# Patient Record
Sex: Female | Born: 1935 | Race: White | Hispanic: No | Smoking: Never smoker
Health system: Southern US, Community
[De-identification: ages and names within clinical notes are randomized; demographics above are authoritative.]

## PROBLEM LIST (undated history)

## (undated) DIAGNOSIS — I10 Essential (primary) hypertension: Secondary | ICD-10-CM

## (undated) DIAGNOSIS — M4800 Spinal stenosis, site unspecified: Secondary | ICD-10-CM

## (undated) DIAGNOSIS — N289 Disorder of kidney and ureter, unspecified: Secondary | ICD-10-CM

## (undated) DIAGNOSIS — R32 Unspecified urinary incontinence: Secondary | ICD-10-CM

## (undated) DIAGNOSIS — E039 Hypothyroidism, unspecified: Secondary | ICD-10-CM

## (undated) DIAGNOSIS — I2699 Other pulmonary embolism without acute cor pulmonale: Secondary | ICD-10-CM

## (undated) DIAGNOSIS — I82409 Acute embolism and thrombosis of unspecified deep veins of unspecified lower extremity: Secondary | ICD-10-CM

## (undated) DIAGNOSIS — M7989 Other specified soft tissue disorders: Secondary | ICD-10-CM

## (undated) DIAGNOSIS — Q600 Renal agenesis, unilateral: Secondary | ICD-10-CM

## (undated) HISTORY — PX: ABDOMINAL HYSTERECTOMY: SHX81

## (undated) HISTORY — PX: APPENDECTOMY: SHX54

## (undated) HISTORY — PX: KNEE SURGERY: SHX244

---

## 1998-03-13 ENCOUNTER — Inpatient Hospital Stay (HOSPITAL_COMMUNITY): Admission: RE | Admit: 1998-03-13 | Discharge: 1998-03-17 | Payer: Self-pay | Admitting: Orthopedic Surgery

## 1998-03-17 ENCOUNTER — Inpatient Hospital Stay (HOSPITAL_COMMUNITY)
Admission: RE | Admit: 1998-03-17 | Discharge: 1998-03-24 | Payer: Self-pay | Admitting: Physical Medicine and Rehabilitation

## 1998-03-25 ENCOUNTER — Encounter
Admission: RE | Admit: 1998-03-25 | Discharge: 1998-06-23 | Payer: Self-pay | Admitting: Physical Medicine and Rehabilitation

## 1998-08-18 ENCOUNTER — Other Ambulatory Visit: Admission: RE | Admit: 1998-08-18 | Discharge: 1998-08-18 | Payer: Self-pay | Admitting: *Deleted

## 2000-05-04 ENCOUNTER — Encounter: Payer: Self-pay | Admitting: Orthopedic Surgery

## 2000-05-04 ENCOUNTER — Inpatient Hospital Stay (HOSPITAL_COMMUNITY): Admission: EM | Admit: 2000-05-04 | Discharge: 2000-05-07 | Payer: Self-pay | Admitting: Emergency Medicine

## 2000-05-04 ENCOUNTER — Encounter: Payer: Self-pay | Admitting: Emergency Medicine

## 2000-05-05 ENCOUNTER — Encounter: Payer: Self-pay | Admitting: Emergency Medicine

## 2003-10-27 ENCOUNTER — Ambulatory Visit: Admission: RE | Admit: 2003-10-27 | Discharge: 2004-01-22 | Payer: Self-pay | Admitting: Radiation Oncology

## 2004-05-13 ENCOUNTER — Ambulatory Visit: Admission: RE | Admit: 2004-05-13 | Discharge: 2004-05-13 | Payer: Self-pay | Admitting: Radiation Oncology

## 2004-07-18 DIAGNOSIS — I2699 Other pulmonary embolism without acute cor pulmonale: Secondary | ICD-10-CM

## 2004-07-18 DIAGNOSIS — I82409 Acute embolism and thrombosis of unspecified deep veins of unspecified lower extremity: Secondary | ICD-10-CM

## 2004-07-18 HISTORY — DX: Acute embolism and thrombosis of unspecified deep veins of unspecified lower extremity: I82.409

## 2004-07-18 HISTORY — DX: Other pulmonary embolism without acute cor pulmonale: I26.99

## 2004-10-14 ENCOUNTER — Ambulatory Visit: Admission: RE | Admit: 2004-10-14 | Discharge: 2004-10-14 | Payer: Self-pay | Admitting: Radiation Oncology

## 2005-06-01 ENCOUNTER — Encounter: Admission: RE | Admit: 2005-06-01 | Discharge: 2005-06-01 | Payer: Self-pay | Admitting: Endocrinology

## 2005-06-04 ENCOUNTER — Inpatient Hospital Stay (HOSPITAL_COMMUNITY): Admission: EM | Admit: 2005-06-04 | Discharge: 2005-06-15 | Payer: Self-pay | Admitting: Emergency Medicine

## 2005-06-06 ENCOUNTER — Encounter: Payer: Self-pay | Admitting: Cardiology

## 2005-06-15 ENCOUNTER — Inpatient Hospital Stay: Admission: RE | Admit: 2005-06-15 | Discharge: 2005-06-21 | Payer: Self-pay | Admitting: Endocrinology

## 2008-08-26 ENCOUNTER — Inpatient Hospital Stay (HOSPITAL_COMMUNITY): Admission: RE | Admit: 2008-08-26 | Discharge: 2008-08-29 | Payer: Self-pay | Admitting: Orthopedic Surgery

## 2010-11-02 LAB — CBC
HCT: 27.8 % — ABNORMAL LOW (ref 36.0–46.0)
HCT: 27.9 % — ABNORMAL LOW (ref 36.0–46.0)
HCT: 33 % — ABNORMAL LOW (ref 36.0–46.0)
HCT: 36.1 % (ref 36.0–46.0)
Hemoglobin: 11.3 g/dL — ABNORMAL LOW (ref 12.0–15.0)
Hemoglobin: 12.2 g/dL (ref 12.0–15.0)
Hemoglobin: 9.4 g/dL — ABNORMAL LOW (ref 12.0–15.0)
Hemoglobin: 9.5 g/dL — ABNORMAL LOW (ref 12.0–15.0)
MCHC: 33.8 g/dL (ref 30.0–36.0)
MCHC: 33.8 g/dL (ref 30.0–36.0)
MCHC: 33.9 g/dL (ref 30.0–36.0)
MCHC: 34.2 g/dL (ref 30.0–36.0)
MCV: 96.1 fL (ref 78.0–100.0)
MCV: 98.2 fL (ref 78.0–100.0)
MCV: 98.6 fL (ref 78.0–100.0)
MCV: 99.3 fL (ref 78.0–100.0)
Platelets: 132 10*3/uL — ABNORMAL LOW (ref 150–400)
Platelets: 142 10*3/uL — ABNORMAL LOW (ref 150–400)
Platelets: 156 10*3/uL (ref 150–400)
Platelets: 99 10*3/uL — ABNORMAL LOW (ref 150–400)
RBC: 2.81 MIL/uL — ABNORMAL LOW (ref 3.87–5.11)
RBC: 2.83 MIL/uL — ABNORMAL LOW (ref 3.87–5.11)
RBC: 3.43 MIL/uL — ABNORMAL LOW (ref 3.87–5.11)
RBC: 3.66 MIL/uL — ABNORMAL LOW (ref 3.87–5.11)
RDW: 13.5 % (ref 11.5–15.5)
RDW: 13.5 % (ref 11.5–15.5)
RDW: 14 % (ref 11.5–15.5)
RDW: 15.6 % — ABNORMAL HIGH (ref 11.5–15.5)
WBC: 13.3 10*3/uL — ABNORMAL HIGH (ref 4.0–10.5)
WBC: 4.2 10*3/uL (ref 4.0–10.5)
WBC: 7 10*3/uL (ref 4.0–10.5)
WBC: 8.4 10*3/uL (ref 4.0–10.5)

## 2010-11-02 LAB — BASIC METABOLIC PANEL
BUN: 30 mg/dL — ABNORMAL HIGH (ref 6–23)
BUN: 33 mg/dL — ABNORMAL HIGH (ref 6–23)
BUN: 41 mg/dL — ABNORMAL HIGH (ref 6–23)
BUN: 42 mg/dL — ABNORMAL HIGH (ref 6–23)
CO2: 19 mEq/L (ref 19–32)
CO2: 23 mEq/L (ref 19–32)
CO2: 24 mEq/L (ref 19–32)
CO2: 26 mEq/L (ref 19–32)
CO2: 26 mEq/L (ref 19–32)
Calcium: 7.7 mg/dL — ABNORMAL LOW (ref 8.4–10.5)
Calcium: 7.9 mg/dL — ABNORMAL LOW (ref 8.4–10.5)
Calcium: 8.1 mg/dL — ABNORMAL LOW (ref 8.4–10.5)
Calcium: 9.1 mg/dL (ref 8.4–10.5)
Chloride: 105 mEq/L (ref 96–112)
Chloride: 107 mEq/L (ref 96–112)
Chloride: 107 mEq/L (ref 96–112)
Chloride: 108 mEq/L (ref 96–112)
Creatinine, Ser: 1.49 mg/dL — ABNORMAL HIGH (ref 0.4–1.2)
Creatinine, Ser: 2.23 mg/dL — ABNORMAL HIGH (ref 0.4–1.2)
Creatinine, Ser: 2.29 mg/dL — ABNORMAL HIGH (ref 0.4–1.2)
Creatinine, Ser: 2.36 mg/dL — ABNORMAL HIGH (ref 0.4–1.2)
GFR calc Af Amer: 24 mL/min — ABNORMAL LOW (ref >60)
GFR calc Af Amer: 25 mL/min — ABNORMAL LOW (ref >60)
GFR calc Af Amer: 25 mL/min — ABNORMAL LOW (ref >60)
GFR calc Af Amer: 41 mL/min — ABNORMAL LOW (ref >60)
GFR calc non Af Amer: 20 mL/min — ABNORMAL LOW (ref >60)
GFR calc non Af Amer: 20 mL/min — ABNORMAL LOW (ref >60)
GFR calc non Af Amer: 21 mL/min — ABNORMAL LOW (ref >60)
GFR calc non Af Amer: 34 mL/min — ABNORMAL LOW (ref >60)
Glucose, Bld: 105 mg/dL — ABNORMAL HIGH (ref 70–99)
Glucose, Bld: 113 mg/dL — ABNORMAL HIGH (ref 70–99)
Glucose, Bld: 113 mg/dL — ABNORMAL HIGH (ref 70–99)
Glucose, Bld: 125 mg/dL — ABNORMAL HIGH (ref 70–99)
Glucose, Bld: 98 mg/dL (ref 70–99)
Potassium: 4.2 mEq/L (ref 3.5–5.1)
Potassium: 4.4 mEq/L (ref 3.5–5.1)
Potassium: 4.8 mEq/L (ref 3.5–5.1)
Potassium: 4.8 mEq/L (ref 3.5–5.1)
Potassium: 5.3 mEq/L — ABNORMAL HIGH (ref 3.5–5.1)
Sodium: 135 mEq/L (ref 135–145)
Sodium: 136 mEq/L (ref 135–145)
Sodium: 137 mEq/L (ref 135–145)
Sodium: 140 mEq/L (ref 135–145)
Sodium: 140 mEq/L (ref 135–145)

## 2010-11-02 LAB — PROTIME-INR
INR: 1.1 (ref 0.00–1.49)
INR: 1.2 (ref 0.00–1.49)
INR: 1.4 (ref 0.00–1.49)
INR: 2.1 — ABNORMAL HIGH (ref 0.00–1.49)
INR: 2.2 — ABNORMAL HIGH (ref 0.00–1.49)
Prothrombin Time: 14.6 seconds (ref 11.6–15.2)
Prothrombin Time: 15.7 seconds — ABNORMAL HIGH (ref 11.6–15.2)
Prothrombin Time: 17.8 seconds — ABNORMAL HIGH (ref 11.6–15.2)
Prothrombin Time: 25.3 seconds — ABNORMAL HIGH (ref 11.6–15.2)
Prothrombin Time: 26.1 seconds — ABNORMAL HIGH (ref 11.6–15.2)

## 2010-11-02 LAB — DIFFERENTIAL
Basophils Absolute: 0 10*3/uL (ref 0.0–0.1)
Basophils Relative: 0 % (ref 0–1)
Eosinophils Absolute: 0.1 10*3/uL (ref 0.0–0.7)
Eosinophils Relative: 1 % (ref 0–5)
Lymphocytes Relative: 17 % (ref 12–46)
Lymphs Abs: 0.7 10*3/uL (ref 0.7–4.0)
Monocytes Absolute: 0.3 10*3/uL (ref 0.1–1.0)
Monocytes Relative: 8 % (ref 3–12)
Neutro Abs: 3.1 10*3/uL (ref 1.7–7.7)
Neutrophils Relative %: 74 % (ref 43–77)

## 2010-11-02 LAB — TYPE AND SCREEN
ABO/RH(D): A POS
Antibody Screen: NEGATIVE

## 2010-11-02 LAB — URINALYSIS, ROUTINE W REFLEX MICROSCOPIC
Bilirubin Urine: NEGATIVE
Glucose, UA: NEGATIVE mg/dL
Hgb urine dipstick: NEGATIVE
Ketones, ur: NEGATIVE mg/dL
Nitrite: NEGATIVE
Protein, ur: NEGATIVE mg/dL
Specific Gravity, Urine: 1.017 (ref 1.005–1.030)
Urobilinogen, UA: 0.2 mg/dL (ref 0.0–1.0)
pH: 6 (ref 5.0–8.0)

## 2010-11-02 LAB — ABO/RH: ABO/RH(D): A POS

## 2010-11-02 LAB — APTT
aPTT: 26 seconds (ref 24–37)
aPTT: 33 seconds (ref 24–37)

## 2010-11-30 NOTE — Op Note (Signed)
NAMEANELA, BENSMAN             ACCOUNT NO.:  [id number]   MEDICAL RECORD NO.:  [id number]          PATIENT TYPE:  INP   LOCATION:  0003                         FACILITY:  [facility]   PHYSICIAN:  Madlyn Frankel. Charlann Boxer, M.D.  DATE OF BIRTH:  03-20-1924   DATE OF PROCEDURE:  DATE OF DISCHARGE:                               OPERATIVE REPORT   PREOPERATIVE DIAGNOSIS:  Right knee osteoarthritis with significant  severe valgus deformity, worse dynamically with gait and weightbearing,  history of left total knee replacement.   POSTOPERATIVE DIAGNOSIS:  Right knee osteoarthritis with significant  severe valgus deformity, worse dynamically with gait and weightbearing,  history of left total knee replacement including obesity and large lower  extremity.   PROCEDURE:  I would have this to be a right total knee replacement with  a 22 modifier due to difficulty of primary knee due to her ligament  laxity and size of her lower extremity.   Components we utilized were a DePuy TC 3 femur utilizing a size 3 TC 3  femur, a size 3 MBT revision tray, a 17.5 TC 3 tibial insert and a 38  patellar button.   SURGEON:  Madlyn Frankel. Charlann Boxer, M.D.   ASSISTANT:  Dwyane Luo, PA-C.   ANESTHESIA:  Duramorph spinal.   SPECIMEN:  None.   FINDINGS:  None.   COMPLICATIONS:  None.   DRAINS:  One Hemovac.   TOURNIQUET TIME:  75 minutes at 250 mmHg.   INDICATIONS FOR PROCEDURE:  [name] is 6 77 year old female referred  for surgical consideration.  She had a history of left total knee  replacement.  She had delayed a significant amount of time before her  right knee was considered with progressive valgus deformity and  increasing pain.  She had failed conservative measures.  Despite being  73, she felt that her quality life was significantly disrupted and  wished to proceed with surgical intervention.  We discussed the  increased risk of infection due to her size of her lower extremity.  Discussed the nature of  her surgery with a valgus nature and the  correctability of it.  Consent was obtained for the benefit of pain  relief.   PROCEDURE IN DETAIL:  The patient was brought to operative theater.  Once adequate anesthesia and preoperative antibiotics, Ancef  administered 2 grams, the patient was positioned supine.  Proximal thigh  tourniquet was placed.  The right lower extremity was pre scrubbed and  prepped and draped in sterile fashion.  Time-out was performed  identifying the patient and extremity.  The leg was exsanguinated,  tourniquet elevated to 250 mmHg.   The patient had a very large lower extremity, slightly valgus tended.  A  midline incision was performed in an attempt to have a straight incision  at closure.  A sharp dissection was carried down to the soft tissue  planes created overlying the extensor mechanism.  A median arthrotomy  was chosen due to her correctable valgus deformity.  Following initial  debridement, attention was directed to the patella.  Precut measurement  was 23 mm.  I resected down to  13 mm and used a 38 patellar button which  covered the cut surface the best.   Lug holes were drilled and a metal shim was placed to protect the  patella from the oscillating saw and retractors.  Attention was now  directed to the femur.  She was noted to have severe osteophyte  formation over the notch with advanced degenerative changes  tricompartmentally.  The proximal medial aspect of the tibia had very  limited peel due to the already lax collateral ligament that was noted.   Intramedullary canal was opened with a drill and irrigated to prevent  fat emboli.  I placed intramedullary rod at 5 degrees of valgus and  resected 10 mm of bone off the distal femur.  This amounted to a very  small cut laterally.   Attention was now directed to the tibia.  The tibia was subluxated  anteriorly.  I used the extramedullary guide and resected 10 mm of bone  off the lateral proximal  tibia.  She had a relatively neutral appearing  tibia.  Despite the advanced lateral compartment degenerative changes.  Following this cut, which appeared to be of normal height, I was able to  fully inspect the status of the MCL which was noted to be lax.  At this  point I decided I would go to a TC 3 component to provide some inherent  mechanical stability to a lax dynamic component of her knee.   With this, this was opened on the back field relatively efficiently.  In  the interim I went back to the femur.  I set the rotation based off the  proximal cut of the tibia which I checked with the alignment rod to  confirm it was perpendicular.   I sized the femur to be a size 3.  The rotation block was pinned into  position based off the proximal cut tibia and then exchanged for the 4  in 1 cutting block.  The anterior, posterior and chamfer cuts were then  made without difficulty or notching.   At this point, the TC 3 femoral component had been opened and the box  cut was made based off this.  The TC 3 box cut was made using the TC 3  instrumentation.   Tibia was then subluxated anteriorly and the tibia was prepared for MBT  tray.  Initially I chose a size 3 tibial tray, drilled it for the MBT  tray and then put a 3 keel trial in place.  Trial reduction now carried  out.  With this, I had released some of the lateral ligaments to help  with the balancing purposes.  I trialed up to a size 15.  There was  noted to be some lateral subluxation of patella and so I did a lateral  release as well.   At this point, the trial components were removed.  We injected the  synovial capsule junction with 0.25% Marcaine with epinephrine and 1 mL  of Toradol.  Final components were opened holding the polyethylene  insert.  Once the components were opened and then the final debridements  and cut surface dried, the final components were cemented into position,  the tibia first and then femur.  The knee  was brought to extension with  a 15-mm insert.   I felt there was a little bit of play in this medial side, so I ended up  choosing the 17.5 poly to help with the medial balancing.   Once cement  cured, excessive cement was removed throughout the knee.  I  examined the posterior aspect of the knee to make sure it was clear.  The final 17.5 insert to match the size 3 TC 3 femur was inserted.  The  knee was reduced, irrigated again with normal saline solution.  A medium  Hemovac drain was placed deep.  The extensor mechanism was then  reapproximated using #1 Vicryl.  The remainder of the wound was closed  with 2-0 Vicryl and  staples on skin due to the thin nature of the skin.  The tourniquet was  let down after 75 minutes.  The knee was cleaned, dried and dressed  sterilely with Xeroform and sterile bulky Jones dressing.  She was  brought to the recovery room in stable condition.  She tolerated the  procedure well.      Madlyn Frankel Charlann Boxer, M.D.  Electronically Signed     MDO/MEDQ  D:  08/26/2008  T:  08/26/2008  Job:  [id number]

## 2010-11-30 NOTE — Discharge Summary (Signed)
[name], [name]             ACCOUNT NO.:  [id number]   MEDICAL RECORD NO.:  [id number]          PATIENT TYPE:  INP   LOCATION:  1440                         FACILITY:  [facility]   PHYSICIAN:  Madlyn Frankel. Charlann Boxer, M.D.  DATE OF BIRTH:  Nov 08, 1923   DATE OF ADMISSION:  08/26/2008  DATE OF DISCHARGE:  08/29/2008                               DISCHARGE SUMMARY   ADMITTING DIAGNOSES:  1. Osteoarthritis.  2. Hypertension.  3. Hypercholesteremia.  4. History of pulmonary embolus on Coumadin.  5. Vertigo.  6. Urinary incontinence.  7. Right mandibular squamous cell cancer.  8. Chronic renal insufficiency.  9. Degenerative disk disease.   DISCHARGE DIAGNOSES:  1. Osteoarthritis.  2. Hypertension.  3. Hypercholesteremia.  4. History of pulmonary embolus on Coumadin.  5. Vertigo.  6. Urinary incontinence.  7. Right mandibular squamous cell cancer.  8. Chronic renal insufficiency.  9. Degenerative disk disease.   HISTORY OF PRESENT ILLNESS:  An 77 year old female with a history of  right knee pain secondary to osteoarthritis with significant valgus  deformity.  She had utilized a cane prior to surgery.  She was  presurgically assessed by her primary care physician, Dr. Corrin Milhorn.  Had full cardiology workup which was negative by Dr. Roger Shelter.   CONSULTATIONS IN HOSPITAL:  Dr. Roger Shelter with cardiology due to  irregular heart beat postoperatively.   PROCEDURE:  Right total knee replaced by surgeon Dr. Durene Romans,  assistant Dwyane Luo, PA-C.   LABORATORY DATA:  CBC final reading white blood cell 13.3, hemoglobin  10.6, hematocrit 30.9 and platelets 99.  Metabolic panel:  Sodium 140,  potassium 4.2, BUN 39, creatinine 2.14 and decreasing and 98 glucose.  Coags:  Her PT was 25.3, INR was 2.1 on August 29, 2008.  Calcium was  8.1.   RADIOLOGY:  Chest x-ray found in chart.   CARDIOLOGY:  EKG and full cardiology workup performed prior to  admission.   HOSPITAL COURSE:  The patient underwent a right total knee replacement  by Dr. Charlann Boxer, admitted to fourth floor telemetry due to irregular heart  beat.  She did remain hemodynamically stable although she did have acute  blood loss anemia requiring transfusion of a couple of units while in  the hospital.  Rehab wise she made slow progress and she had difficulty  with balance, difficulty with quad function.  Her dressing was changed  and there was no significant drainage from the wound.  There were a  little bit of blood droplets however, no sign of infection.  Dr. Deborah Chalk  saw the patient on August 27, 2008 due to the irregular heart beat  with recommendations of continued monitoring as well as continued use of  beta-blockers which was started preoperatively.  Coumadin was continued.  She reached therapeutic levels prior to discharge.  She had improving  creatinine function after transfusion.  When seen on August 29, 2008  she was afebrile, no significant events, no complaints, hemodynamically  was therapeutic with decreasing creatinine level and was ready for  discharge to nursing facility rehab.   DISCHARGE DISPOSITION:  Discharge to  skilled nursing facility rehab in  stable and improved condition.   DISCHARGE PHYSICAL THERAPY:  She is weightbearing as tolerated with the  use of a rolling walker.  Want to maintain a knee immobilizer until she  has increased quad function and is able to do a straight leg raise.  At  the point she can do a straight leg raise  with good quad function,  discontinued the knee immobilizer.   DISCHARGE DIET:  Heart-healthy.   DISCHARGE WOUND CARE:  Keep wound dry.  Change dressing on daily basis.  The patient may shower, just cover with plastic or other impervious  surface, tape edges.  Change dressing on daily basis.   DISCHARGE MEDICATIONS:  1. Coumadin dosage which is 1 mg tablets - she take 3 tablets in the      morning.  2. Crestor 5 mg p.o.  q.a.m.  3. Metoprolol 25 mg one p.o. b.i.d.  4. Ramipril 10 mg one p.o. daily.  5. Furosemide 20 mg one p.o. daily.  6. Potassium chloride 10 mEq p.o. daily.  7. Aspirin 81 mg - hold.  8. Iron 325 mg one p.o. t.i.d. x2 weeks then resume daily.  9. Robaxin 500 mg one p.o. q.6 h.  10.Colace 100 mg p.o. b.i.d.  11.MiraLax 17 grams p.o. daily.  Both the Colace and MiraLax can be      p.r.n. while on narcotics.  12.Norco 7.5/325 one to two p.o. q.4-6 h. p.r.n. pain.   DISCHARGE FOLLOWUP:  Follow up with Dr. Charlann Boxer at phone number [phone] in  2 weeks for appointment, already has an appointment scheduled on  September 08, 2008.   Any cardiology related questions, refer to Dr. Deborah Chalk.  Her primary  care physician is Dr. Dagoberto Ligas.     ______________________________  Yetta Glassman [address], [place]      Madlyn Frankel. Charlann Boxer, M.D.  Electronically Signed    BLM/MEDQ  D:  08/29/2008  T:  08/29/2008  Job:  [id number]   cc:   Colleen Can. Deborah Chalk, M.D.  Fax: [phone]   Alfonse Alpers. Dagoberto Ligas, M.D.  Fax: [phone]

## 2010-11-30 NOTE — H&P (Signed)
[name], [name]             ACCOUNT NO.:  [id number]   MEDICAL RECORD NO.:  [id number]          PATIENT TYPE:  INP   LOCATION:                               FACILITY[facility]  [facility]   PHYSICIAN:  Madlyn Frankel. Charlann Boxer, M.D.  DATE OF BIRTH:  01-21-1924   DATE OF ADMISSION:  08/26/2008  DATE OF DISCHARGE:                              HISTORY & PHYSICAL   REASON FOR ADMISSION:  Right knee osteoarthritis.   HISTORY OF PRESENT ILLNESS:  An 77 year old female with a history of  right knee pain secondary to osteoarthritis with significant valgus  deformity.  Utilizes cane for ambulation.  She has been refractory to  all conservative treatment.  Been presurgically assessed by her primary  care physician, Dr. Corrin Mcknight.   PAST MEDICAL HISTORY:  1. Osteoarthritis.  2. Hypertension.  3. Hypercholesteremia.  4. History of pulmonary embolus on Coumadin.  5. Vertigo.  6. Urinary incontinence.   PAST SURGICAL HISTORY:  1. Appendectomy.  2. Hysterectomy 1994.  3. Left total knee replacement in 1996.  4. Bilateral cataract surgeries 2001.  5. 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  6. Mohs surgery in June of 2007 of her nose and forehead.   FAMILY HISTORY:  Heart disease.   SOCIAL HISTORY:  She is widowed, nonsmoker.  She does live alone, is  planning rehab stay, preferably [facility], secondary choice [facility].   DRUG ALLERGIES:  PENICILLIN AND CODEINE.   MEDICATIONS:  1. Crestor 5 mg p.o. daily.  2. Metoprolol 25 mg one p.o. b.i.d.  3. Darvocet N-100.  4. Coumadin 3 mg daily.  5. Aspirin 81 mg daily.  6. Iron 1 daily.  7. Potassium 10 mEq p.o. daily.  8. Furosemide 20 mg p.o. daily.  9. Ramipril 10 mg one p.o. daily.   She stopped her Coumadin on February 3, started Lovenox on February 4  for five injections for bridging therapy.   REVIEW OF SYSTEMS:  HEENT AND NEURO:  She has balance problems and  ringing in her ears.  RESPIRATORY:  Last chest x-ray was on August 14, 2008.   CARDIOVASCULAR:  She does have increased swelling and edema and  her last EKG was done on August 14, 2008.  GENITOURINARY:  She has  incontinence and increased urination at night.  MUSCULOSKELETAL:  She  has back pain.  Otherwise see HPI.   PHYSICAL EXAM:  Pulse 72, respirations 16, blood pressure 144/80.  GENERAL:  Awake, alert and oriented.  HEENT:  Normocephalic.  NECK:  Supple, no carotid bruits.  CHEST:  Lungs are clear to auscultation bilaterally.  BREASTS:  Deferred.  HEART:  Regular rate and rhythm, S1-S2 distinct.  ABDOMEN:  Soft, nontender, nondistended, bowel sounds present.  GENITOURINARY:  Deferred.  EXTREMITIES:  Right knee has significant valgus deformity.  SKIN:  No cellulitis right lower extremity.  NEUROLOGIC:  Intact distal sensibilities.   LABORATORY DATA:  All labs pending presurgical testing.  EKG, chest x-  ray performed by primary care physician on August 14, 2008.   IMPRESSION:  Right knee osteoarthritis with significant valgus  deformity.   PLAN OF  ACTION:  Right total knee replacement at [facility]  August 26, 2008 by surgeon Dr. Durene Romans.  Risks and complications  were discussed.   No postoperative medications were provided as patient is planning rehab  stay at [facility].     ______________________________  Yetta Glassman. [address], [place]      Madlyn Frankel. Charlann Boxer, M.D.  Electronically Signed    BLM/MEDQ  D:  08/20/2008  T:  08/20/2008  Job:  [id number]   cc:   Alfonse Alpers. Dagoberto Ligas, M.D.  Fax: [phone]

## 2010-11-30 NOTE — Consult Note (Signed)
[name], [name]             ACCOUNT NO.:  [id number]   MEDICAL RECORD NO.:  [id number]          PATIENT TYPE:  INP   LOCATION:  1440                         FACILITY:  [facility]   PHYSICIAN:  Colleen Can. Deborah Chalk, M.D.DATE OF BIRTH:  [birth year]   DATE OF CONSULTATION:  08/27/2008  DATE OF DISCHARGE:                                 CONSULTATION   We were asked by Dr. Charlann Boxer to evaluate [name] for an irregular  heartbeat.  She is an 77 year old female with a history of obesity,  hypertension, prior normal heart catheterization, remote syncope and  osteoarthritis.  She presents for this admission for a right total knee  replacement.  Concern is for PACs and a questionable atrial fibrillation  earlier today.   PAST MEDICAL HISTORY:  1. Syncope associated with elevated cardiac enzymes.  A subsequent      cardiac catheterization was normal in November 2006.  2. Spinal stenosis.  3. Left knee surgery.  4. Hypercholesterolemia.  5. Obesity.  6. Gastritis.  7. Right mandibular squamous cell cancer.  8. Chronic renal insufficiency.  9. Herniated disk.  10.Hysterectomy.  11.Appendectomy.  12.History of pulmonary embolism.  13.Chronic Coumadin anticoagulation.  14.Vertigo.  15.Urinary incontinence.  16.Recent negative Cardiolite study done as a preoperative evaluation.   She has had a history of allergies to:  1. PENICILLIN.  2. LIPITOR.  3. CODEINE.   CURRENT MEDICINES:  1. Crestor 5 mg a day.  2. Metoprolol 25 mg b.i.d.  3. Darvocet compound.  4. Coumadin 3 mg a day.  5. Aspirin.  6. Iron.  7. Lasix 20 mg a day.  8. KCl 10 mEq a day.  9. Ramipril 10 mg per day.   FAMILY HISTORY:  Father died of Alzheimer's disease.  Mother lived into  her upper 96s.  One brother has heart disease.   SOCIAL HISTORY:  She is widowed.  She retired from [employer].  No alcohol, no tobacco.   REVIEW OF SYSTEMS:  No chest pain, no shortness of breath.  Overall, she  is doing  well postop.  Her appetite is good.  She is eating well.  All  other review of systems is negative.   EXAM:  GENERAL:  She is in no acute distress.  She was eating dinner.  Awake and alert. Responds appropriately.  Blood pressure is 114/42, heart rate is in the 80s, it is normal.  SKIN:  Warm and dry.  Color is normal.  EYES:  She had pupils equally round and reactive to light.  Sclerae is  nonanicteric.  Conjunctivae is clear.  Oropharynx unremarkable.  NECK:  Supple.  There is no adenopathy, no masses.  There is a scar on  the right neck.  LUNGS:  Clear with decreased breath sounds. No dyspnea.  HEART:  Regular rate and rhythm. No murmur.  ABDOMEN:  Obese with positive bowel sounds. Nontender.  EXTREMITIES:  Limited ROM of the R knee. Right knee in the CPM.  No  edema.  Gait is not tested.  NEUROLOGIC:  Intact. No gross focal deficits.   PERTINENT LABS:  Hematocrit 27,  white count 8000, platelets 132,000.  Potassium is 5.3, glucose 125, BUN 33, creatinine 2.2.  INR is 1.2.  EKG  no current tracings.  The preop adenosine/Cardiolite tracings were  normal.  It did show an rSR-prime in V1.   OVERALL IMPRESSION:  1. Postoperative total knee replacement.  2. Probable postoperative premature atrial contractions.  3. History of normal catheterization in 2006.   PLAN:  We will continue beta blockers.  Continue telemetry.  We will see  tomorrow and be available should problems rise otherwise.      Colleen Can. Deborah Chalk, M.D.  Electronically Signed     SNT/MEDQ  D:  08/27/2008  T:  08/27/2008  Job:  [id number]

## 2010-12-03 NOTE — Consult Note (Signed)
NAMEROMY, IPOCK             ACCOUNT NO.:  [id number]   MEDICAL RECORD NO.:  [id number]          PATIENT TYPE:  INP   LOCATION:  4710                         FACILITY:  [facility]   PHYSICIAN:  Vanetta Mulders, MD         DATE OF BIRTH:  08/09/1923   DATE OF CONSULTATION:  06/08/2005  DATE OF DISCHARGE:                                   CONSULTATION   HISTORY OF PRESENT ILLNESS:  Mrs. Jamison is a 77 year old woman with  multiple problems, degenerative joint disease, hypertension, obesity,  hyperlipidemia, status post squamous cell carcinoma of the right mandible.  She presents to [facility]  for syncope.  She was admitted for  evaluation on June 04, 2005 and night prior to admission the patient  fell and lost consciousness.  She cannot recall for how long she passed out.  She did not have any ___________ or bowel incontinence and no tongue biting  as far as she can remember.  She felt lightheaded right before the syncopal  episode and she denied any vertigo at admission.  She had a similar episode  prior to admission when she tried to stand up and she again fell and lost  consciousness when Dr. Sharene Skeans, neurology, saw her in the hospital.  During  the current hospitalization, the patient presented with a creatinine of 1.8  and BUN of 50 and potassium of 5.  D-dimer was found elevated to more than  20 and cardiac markers had been positive.  The patient underwent cardiac  catheterization which showed no coronary artery disease.  Her creatinine has  been stable throughout hospitalization.  It increased slightly to a level of  2.4 on the second day after admission but when seen by nephrology, her  creatinine is approximately at baseline, being 2 and BUN 43.  The patient  also had 2-D echo done that showed left ventricular ejection fraction within  normal limits but right ventricle moderately to markedly dilated, mild to  moderate tricuspid regurgitation. The patient also  underwent an ultrasound  of the kidneys that could not visualize the left kidney, so nephrology was  consulted for possible renal artery stenosis on the left with ?.   ALLERGIES:  The patient is allergic to PENICILLIN, CODEINE, and LIPITOR.   PAST MEDICAL HISTORY:  1.  Spinal stenosis.  2.  Left knee surgery.  3.  Obesity.  4.  Hyperlipidemia.  5.  Hypertension.  6.  Gastritis.  7.  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  8.  Chronic renal insufficiency.  9.  History of herniated disk.  10. Hysterectomy.  11. Appendectomy.   MEDICATIONS AT HOME:  1.  Toprol-XL 50 mg daily.  2.  Maxzide.  3.  Hydrochlorothiazide.  4.  Triamterene.  5.  Crestor.  6.  Percocet.  7.  Calcium.   MEDICATIONS DURING HOSPITALIZATION:  During hospitalization, the patient  received:  1.  Aspirin 325 mg daily.  2.  Iron 325 mg p.o. three times a day.  3.  Lasix 40 mg two times a day.  4.  Heparin drip.  5.  Keppra 500 mg twice a day.  6.  Lopressor 25 mg p.o. twice a day.  7.  Protonix 40 mg p.o. daily.  8.  Crestor 10 mg p.o. q.18h.  9.  Tylenol 650 mg q.4h.  10. Lortab one tablet p.o. q.6h.  11. Nitroglycerin 50 mg.  12. Ativan 0.5 mg daily.   PHYSICAL EXAMINATION:  VITAL SIGNS:  Temperature 98, heart rate 60,  respiratory rate 20, blood pressure 93/55, oxygen saturation 94% on 2 L.  Weight 259.2 pounds.  Blood pressure lying down is 93/55 with a heart rate  of 60 and sitting is 100/60 with a heart rate of 58.  GENERAL:  A 77 year old white lady in no acute distress.  CARDIOVASCULAR:  Regular rate and rhythm.  No murmurs, no rubs, no gallops.  RESPIRATORY:  Clear to auscultation.  ABDOMEN:  Bowel sounds positive.  Soft, nontender, nondistended.  EXTREMITIES:  No edema.  NEUROLOGIC:  No deficits.  PSYCHOLOGIC:  Alert and oriented x3.   IMPRESSION:  Problem #1.  Chronic renal failure most likely secondary to  hypertension.  Creatinine has been stable throughout the  hospitalization. Is  at baseline.  It would be advisable to change hydrochlorothiazide with Lasix  to decrease the __________ filtration rate in this patient.  If using  triamterene, monitor for hyperkalemia and also avoid NSAIDs.  It is  advisable to have a tight blood pressure control and treatment of co-  morbidities.   Problem #2.  Left kidney non-visualized by ultrasound.  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 There  could be other possibilities like an ischemic kidney or a necrotic left  kidney secondary to post renal problems versus other.  I suspect that if  either one of these last processes occurred, they must have occurred in the  past.  The patient would have some symptoms or at least urinalysis changes  at this time if she underwent an ischemic process or an __________ process  recently.   Problem #3.  Syncope.  Followed by neurology for seizure activity. Also by  primary team for DVTs and pulmonary embolism and treated appropriately.   Problem #4.  Disposition.  The patient will need to follow up as outpatient  with nephrology for chronic renal insufficiency.      Vanetta Mulders, MD    DA/MEDQ  D:  06/08/2005  T:  06/09/2005  Job:  [id number]

## 2010-12-03 NOTE — Consult Note (Signed)
[name], [name]             ACCOUNT NO.:  [id number]   MEDICAL RECORD NO.:  [id number]          PATIENT TYPE:  INP   LOCATION:  1829                         FACILITY:  [facility]   PHYSICIAN:  Deanna Artis. Hickling, M.D.DATE OF BIRTH:  07-30-1923   DATE OF CONSULTATION:  06/04/2005  DATE OF DISCHARGE:                                   CONSULTATION   IDENTIFYING INFORMATION/JUSTIFICATION FOR ADMISSION AND CARE:  The patient  is a 77 year old right handed widowed woman who lives at home alone.   CHIEF COMPLAINT:  Syncope with falling.   REASON FOR CONSULTATION:  I was asked by Dr. Reather Littler to see the patient  to determine the etiology of her syncope and her persistent dizziness.   HISTORY OF PRESENT ILLNESS:  The patient was in her usual state of health.  She has gotten up when she suddenly fell to the ground without warning. This  occurred about 10:00 p.m. She may have been down on the ground for about 30  minutes. She got up from a chair and passed out in a different room. She had  difficulty getting up and complained of dizziness and nausea. She felt light  headed and off balance but no vertiginous.   The patient contacted her daughter and son-in-law who came over. They  assisted her from the chair that she was in to bed. They rolled her into the  bedroom and made a transfer. They could not tell that there were any focal  or localized neurological problems. The patient felt dizzy when moving in  bed and was nauseated but had no vomiting.   This morning she went to the commode and when she got up, she felt more  dizzy and collapsed to the right side, bruising the right forehead. She  continued to feel dizzy and off balance with occasional nausea. She was  brought to the emergency room for evaluation and admitted by Dr. Reather Littler.   PAST MEDICAL HISTORY:  1.  Hypertension.  2.  Dyslipidemia.  3.  Previous history of vertigo.   PAST SURGICAL HISTORY:  The patient had  abdominal hysterectomy in 1993. A  left knee replacement about 15 years ago. 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 This took place in April of 2005. Her  most recent medical problem is low back pain with pain and numbness in her  toe, thought to represent a ruptured disk. This has been evaluated only with  simple plain films of her back.   CURRENT MEDICATIONS:  Include Crestor, Percocet, Toprol,  hydrochlorothiazide.   ALLERGIES:  NONE KNOWN.   FAMILY HISTORY:  Negative for stroke.   SOCIAL HISTORY:  The patient lives alone. Her daughter and son-in-law live  close by. She is widowed. She does not use tobacco or alcohol.   REVIEW OF SYSTEMS:  A 12 system review is recorded. The patient has been  physically well other than her current medical problems and denies chest  pain, palpitations, diarrhea,  vomiting, cough, rhinorrhea, diabetes, thyroid  disease. She has some arthritis. She has had swollen legs for unknown  reasons. She has not had any intercurrent infections in the head, neck,  lungs, GI, or GU. Her appetite has been fine. Sleep is fair. She has some  problems with early arousal. A 12 system review is otherwise negative.   PHYSICAL EXAMINATION:  GENERAL:  This is a morbidly obese, right handed  woman in no acute distress, while lying slightly in Trendelenburg on her  stretcher.  VITAL SIGNS:  Blood pressure 123/37, resting pulse 72 lying; 119/49 with  resting pulse of 72 in a sitting position; 126/44 with resting pulse of 80  in a standing position. Temperature 96.8. Oxygen saturation 96%.  HEENT:  No infections. No bruits.  NECK:  Supple.  LUNGS:  Clear.  HEART:  No murmurs. Pulse is normal.  EXTREMITIES:  Swollen in the lower extremities. She has some brawny edema,  nontender. She has venous stasis.  NEUROLOGIC:  Mental status, the patient is awake and alert  except when post-  ictal (see below). She names objects and follows commands. Cranial nerves,  non-reactive pupils. Visual fields full. Extraocular movements full.  Symmetric facial strength midline tongue and uvula. Air conduction greater  than bone conduction. Motor examination, the patient has normal strength in  her left upper extremity and also the right upper extremity except for the  biceps, which is 4 over 5. There is no drift. Fine motor movements are  normal. In her hip flexors, she is 4 in the right, 3 in the left, largely  because of the back pain. She had fairly normal strength in other muscles  tested in the lower extremities. Sensation, severe stocking neuropathy to  the upper calves bilaterally. She does not show signs of radicular numbness.  She has decreased laboratory sensation through her shin, preserved  proprioception to the toes. Good stereognosis bilaterally. The upper  extremities are normal. Cerebellar, good finger-to-nose and repetitive  movements. No hemi-ataxia. Gait was broad based. She does not fall to one  side.   When I got her up, the patient said that she became dizzy. We were able to  get her back into bed and then she suddenly became dead weight. I noted that  she was not looking at me. I opened her eyes and her eyes deviated to the  right. She had stertorous breathing. She did not have clonic or tonic  behaviors. This lasted for about a minute and then she had post-ictal  drowsiness for a couple of minutes.   IMPRESSION:  1.  Syncope (780.2)  2.  Single seizure, not definitely epilepsy (780.39). This seems to be      complex partial in nature.  3.  Falls, complaining of dizziness with closed head injury, without a CT      brain lesion (I have reviewed the CT personally).  4.  Risk factors for stroke including hypercholesterolemia, hypertension,      dyslipidemia.   PLAN: 1.  MRI of the brain without contrast, under Ativan, looking for a  stroke.  2.  EEG on Monday.  3.  Seizure precautions.  4.  Will start Keppra 500 mg twice daily.  5.  Out of bed with assistance only.   I appreciate the opportunity to see the patient. Will continue to follow  along with you.      Deanna Artis. Sharene Skeans, M.D.  Electronically Signed     WHH/MEDQ  D:  06/04/2005  T:  06/05/2005  Job:  [id number]   cc:   Alfonse Alpers. Dagoberto Ligas, M.D.  Fax: [phone]   Reather Littler, M.D.  Fax: [phone]

## 2010-12-03 NOTE — Consult Note (Signed)
NAMEJODENE, POLYAK             ACCOUNT NO.:  [id number]   MEDICAL RECORD NO.:  [id number]          PATIENT TYPE:  INP   LOCATION:  4729                         FACILITY:  [facility]   PHYSICIAN:  Colleen Can. Deborah Chalk, M.D.DATE OF BIRTH:  11-Jul-1924   DATE OF CONSULTATION:  06/05/2005  DATE OF DISCHARGE:                                   CONSULTATION   Thank you very much for asking [place] to see Ms. Michiels.  She presents with an  episode of syncope two nights ago.  She remembered getting up from the  chair, going to the bed about 10 p.m. and passed out without really warning  and questionable duration.  Family was called.  She slept poorly.  About 5  a.m. yesterday morning she awoke, went to the bathroom, and had recurrent  syncope.  She had been on Percocet because of spinal stenosis for  approximately 48 hours prior to this episode.  She has not had any chest  pain prior to this admission.  There is no chest pain leading up to the  hospitalization or syncopal episodes.  She did have some vague chest  tightness in the hospital earlier this morning relieved with nitroglycerin.  She did get hypotensive with this.   PAST MEDICAL HISTORY:  1.  History of spinal stenosis.  2.  Previous left knee surgery.  3.  Hyperlipidemia.  4.  Obesity.  5.  Hypertension.  6.  History of gastritis.  7.  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  8.  Chronic renal insufficiency.  9.  History of a herniated disk.  10. Hysterectomy.  11. Previous appendectomy.   ALLERGIES:  1.  PENICILLIN.  2.  LIPITOR.   CURRENT MEDICATIONS:  1.  Toprol-XL 50 mg a day.  2.  Maxzide.  3.  Crestor.  4.  Oxycodone, recently started.  5.  calcium   FAMILY HISTORY:  Father died of Alzheimer's.  Mother is living at age 82,  lives in a nursing home.  One brother is alive with heart problems.   SOCIAL HISTORY:  One daughter.  Widow, she has been widowed for one year.  She was previously  employed in [employer].   REVIEW OF SYSTEMS:  No other chest pain other than reported.  She has  chronic obesity.   PHYSICAL EXAMINATION:  GENERAL:  No acute distress.  Currently pain-free.  VITAL SIGNS:  Blood pressure 106/58, heart rate is in the 80s.  SKIN:  Warm and dry.  Color is normal.  LUNGS:  Reasonably clear.  HEART:  Regular rate and rhythm without murmur or gallop.  ABDOMEN:  Obese.  EXTREMITIES:  Very full without significant edema.  Peripheral pulses are  present.  Femoral pulses are near the surface and actually easy to palpate,  especially considering the degree of obesity in the lower extremities and  low abdomen.   PERTINENT LABORATORIES:  EKG shows inferior ST elevation, anterior T wave  inversion, with sinus bradycardia.  Hematocrit is 31, white count is 6000,  platelets 123,000.  BUN is 51, creatinine of 2.1, glucose  of 115.  Troponin  is 2.16.  CK is 138 with an MB of 12.4.  D-dimer is greater than 20.   Chest x-ray is compatible with cardiomegaly.   IMPRESSION:  1.  Acute myocardial infarction with history of syncope.  2.  Renal insufficiency.  3.  Spinal stenosis with chronic pain syndrome, recently started on      narcotics.  4.  Hypertension.  5.  Hypercholesterolemia.  6.  Obesity.   PLAN:  We will hydrate gently.  We will start IV heparin and IV  nitroglycerin.  We will follow the labs.  We will check a 2D echocardiogram  tomorrow.  Probably consider catheterization on Tuesday or Wednesday,  depending upon her renal failure or how stable she is from a cardiac  standpoint.  I will try to add beta-blockers as her heart rate allows.      Colleen Can. Deborah Chalk, M.D.  Electronically Signed     SNT/MEDQ  D:  06/05/2005  T:  06/06/2005  Job:  [id number]   cc:   Alfonse Alpers. Dagoberto Ligas, M.D.  Fax: [phone]

## 2010-12-03 NOTE — Op Note (Signed)
[facility]  Patient:    [name], [name]                    MRN: [id number] Adm. Date:  [id number] Attending:  Dominica Severin                           Operative Report  PREOPERATIVE DIAGNOSIS:  Left open fifth metatarsal fracture, status post fall, with a type 1 open wound.  POSTOPERATIVE DIAGNOSIS:  Left open fifth metatarsal fracture, status post fall, with a type 1 open wound.  PROCEDURE: 1. Irrigation and debridement type 1 open left fifth metatarsal fracture at    the distal third region. 2. Open treatment of metatarsal fracture.  SURGEON:  Elisha Ponder, M.D.  ANESTHESIA:  Spinal.  ESTIMATED BLOOD LOSS:  Minimal.  TOURNIQUET TIME:  Less than 30 minutes.  INDICATIONS FOR PROCEDURE:  This patient is a 77 year old white female who sustained the above-mentioned injury via a twisting fall earlier today.  The patient was given a tetanus shot and preoperative vancomycin due to a penicillin allergy.  Due to her open wound, I consented her for I&D and repair of structures as necessary.  She understood all risks and benefits, to include the risk of infection, bleeding, anesthesia, damage to normal structures, and failure of surgery to accomplish the intended goals of relieving symptoms and restoring function.  OPERATIVE FINDINGS:  This patient had a type 1 open left fifth metatarsal fracture.  This underwent I&D, including skin and subcutaneous tissue and bone.  The patient underwent reapproximation of the bone without hardware fixation.  DESCRIPTION OF PROCEDURE:  The patient was seen by myself and anesthesia.  She was then taken to the operative suite, and spinal anesthesia was administered by Lestine Box, M.D.  She was appropriately padded, prepped and draped in the usual sterile fashion about the left lower extremity.  Following this, the patient had the extremity elevated and the tourniquet then inflated to 300 mmHg approximately.   Following this, the patient had incision made plantarly about the type 1 open wound.  Dissection was carried down through skin with a knife blade.  Following this, the patient had debridement of the skin edges, including a 1 mm rim of skin that was excised.  Following this, dissection was carried down to the bony fracture.  This was an obvious fracture, as noted by placing a hemostat through the type 1 open wound and feeling the fracture fragments through a tract that was previously created by the trauma.  The patient had retractors placed as needed and had the bony fracture fragments exposed.  Following this, I&D, including copious amounts of irrigation greater than 1 L, as well as curettage of the bony fragments ensued.  There were no loose bodies or foreign bodies in the wound.  Copious I&D was accomplished, the tourniquet was then deflated.  The wound was noted to bleed without difficulty.  There was no excessive hemorrhage.  The foot had excellent refill.  The patient then had the wound approximated with interrupted nylon suture, leaving portions of the wound open to allow for the egress of fluid. Following this, x-rays were taken, which showed acceptable position of her bone.  This was satisfactory, and thus I did not perform any aggressive internal fixation.  With this noted, the patient underwent a sterile dressing. A sterile dressing was applied without difficulty.  She was then transferred to the recovery room bed  and to the recovery room in stable condition.  All sponge, needle, and instrument counts were reported as correct.  There were no complications.  She will be admitted for IV antibiotics, elevation, and general precautions. We will monitor her status closely and keep her nonweightbearing until fracture consolidation. DD:  05/05/00 TD:  05/05/00 Job: [id number]

## 2010-12-03 NOTE — H&P (Signed)
[name], [name]             ACCOUNT NO.:  [id number]   MEDICAL RECORD NO.:  [id number]          PATIENT TYPE:  ORB   LOCATION:  4524                         FACILITY:  [facility]   PHYSICIAN:  Alfonse Alpers. Gegick, M.D.DATE OF BIRTH:  09/12/1923   DATE OF ADMISSION:  06/15/2005  DATE OF DISCHARGE:                                HISTORY & PHYSICAL   HISTORY:  This is a 77 year old woman who was admitted to the hospital  previously with an episode of profound syncope.  She sustained a contusion  to the right eye and was evaluated.  She had a CAT scan and also coronary  artery studies given the fact that she had positives enzymes consistent with  a subacute myocardial infarction.  However, her diagnosis was a pulmonary  embolism and she also had deep venous thrombosis.  She was treated initially  with Lovenox and then Coumadin and became therapeutic on Coumadin.  In  addition, she was found to have an elevated creatinine.  This has been  subacute.  Renal studies were done and she has a left renal agenesis.   She has had a history of chronic kidney disease associated with that and  also an anemia.  Her hemoglobins have been settling in around 10.  An iron  level was low and she was given IV iron therapy.   She also has probably the primary event causing her deep venous thrombosis  is a carcinoma of the right mandible.   PHYSICAL EXAMINATION:  GENERAL:  Well-developed woman who appears stable at  this time.  She is moderately weak.  HEENT:  Normocephalic.  LUNGS:  Clear.  CARDIOVASCULAR:  Rhythm is regular.   IMPRESSION:  1.  Status post deep venous thrombosis plus pulmonary embolism with      generalized muscle weakness.  2.  Left renal agenesis.  3.  Chronic kidney disease.  4.  Carcinoma of the mandible.           ______________________________  Alfonse Alpers. Dagoberto Ligas, M.D.     CGG/MEDQ  D:  06/16/2005  T:  06/16/2005  Job:  [id number]

## 2010-12-03 NOTE — Procedures (Signed)
EEG NUMBER:  [id number].   REQUESTING PHYSICIAN:  Alfonse Alpers. Dagoberto Ligas, M.D.   CLINICAL HISTORY:  A 77 year old woman evaluated for syncope. EEG is  performed for evaluation of possible seizure. This is routine EEG done with  photic stimulation but not hyperventilation. The patient is described as  awake and asleep.   DESCRIPTION:  The dominant rhythm of this tracing is a moderate amplitude  theta alpha rhythm of 7 to 8 hertz which predominates posteriorly without  abnormal asymmetry and attenuates with eye opening and closing. Low  amplitude fast activity is seen frontally and centrally and appears without  abnormal asymmetry. No focal slowing is noted. No epileptiform discharges  are seen. Drowsiness occurred naturally as evidenced by generalized  attentuation of rhythms and slowing into the 6 to 7 hertz range. No  abnormality is seen in drowsiness. Stage 2 sleep is not achieved. Photic  stimulation produced weak driving responses. Hyperventilation was not  performed. Single channel devoted to EKG revealed sinus rhythm throughout  with rate of approximately 60 beats per minute.   CONCLUSION:  Abnormal study to the presence of mid generalized slowing of  the background rhythms and findings suggestive of diffuse widespread  cerebral dysfunction and consistent with a mild encephalopathic and/or  demented state. No focal slowing is noted, and no epileptiform discharges  are seen.      Michael L. Thad Ranger, M.D.  Electronically Signed     AVW:[id number]  D:  06/06/2005 21:06:09  T:  06/07/2005 10:37:55  Job #:  [id number]

## 2010-12-03 NOTE — Discharge Summary (Signed)
[name], [name]             ACCOUNT NO.:  [id number]   MEDICAL RECORD NO.:  [id number]          PATIENT TYPE:  INP   LOCATION:  4710                         FACILITY:  [facility]   PHYSICIAN:  Alfonse Alpers. Gegick, M.D.DATE OF BIRTH:  26-Mar-1924   DATE OF ADMISSION:  06/04/2005  DATE OF DISCHARGE:                                 DISCHARGE SUMMARY   HISTORY:  This is a 77 year old woman who presents with having 2 episodes of  syncope.  The patient interestingly had a history of left thigh pain  approximately 1 week prior to this admission.  Her symptoms were  nonspecific.  She had some discomfort in her left thigh; palpation revealed  no tenderness and it was assumed that she had a radicular-type pain from her  back.  X-rays of her lumbar spine showed diffuse degenerative changes.  She  did have some questionable positive left leg raising at that time.  She  presents now with a history of syncope; she has had 2 episodes.   She has a history of dyslipidemia in the past.  Recently, she has had  increasing BUN and creatinine of unclear etiology.  She has a history of  squamous cell carcinoma of the right mandible and was treated with surgery  in the past.   Her physical examination revealed a well-developed woman who appeared  stable.  She had an ecchymosis over the right supraorbital ridge.  Her lungs  were clear.   She was seen in consultation by the Neurology Service.  It was their  impression that she had a seizure.  She had an  EEG which was negative.   IMPRESSION ON ADMISSION:  Syncopal episode, etiology to be determined.   HOSPITAL COURSE:  She was admitted to the hospital.  Neurology started her  on Keppra at twice-daily dosage.  She then began having symptoms of chest  pain.  She was seen in consultation by the Cardiology Service; it was their  opinion that she had had a myocardial infarction (subacute endocardial  myocardial infarction).  She had coronary catheterization  done and this  showed normal coronary arteries.  A ventilation perfusion lung scan was then  done, along with venous Dopplers bilaterally; this showed a high probability  of a pulmonary embolus to the lung; in addition, she had deep venous  thrombosis.  She was treated with Lovenox during her hospitalization and  then Coumadin was started.  Of note was the fact that she did have an  elevated creatinine -- this was 2.4 -- and a BUN of 55.  The patient has  ultrasound of the abdomen to evaluate kidney size; this showed an absence of  the left kidney.  She was seen in consultation by the Nephrology Service.  It was their impression that she had an agenesis of her left kidney.  She  did have an anemia.  Her hemoglobin decreased from 10.3 to 9.4 and  stabilized.  Her iron level was low at 32.  Her B12 was 307.  She was  treated with IV iron therapy for a period of 4 days.  Her hemoglobin  has  remained stable at 9.0.  She was anticoagulated with Coumadin and once her  Coumadin anticoagulation was adequate, she had her Lovenox discontinued.  She continued to improve.  She did not have any chest pain, but was markedly  weak.  She had moderate shortness of breath with her  ambulation.  She was  then transferred to the subacute care unit for further physical therapy and  ambulation.   IMPRESSION ON DISCHARGE:  1.  Acute pulmonary embolism.  2.  Renal failure (chronic).  3.  Agenesis of the left kidney.  4.  Carcinoma of the mandible.   MEDICATIONS ON DISCHARGE:  She will continue being treated with the:  1.  Crestor 10 mg daily.  2.  Protonix 40 mg daily.  3.  Ferrous sulfate 325 mg daily.  4.  Coumadin according to pharmacy protocol.  5.  MiraLax one scoop daily.  6.  Lasix 40 mg daily.  7.  Lopressor 25 mg daily.  8.  Enteric-coated aspirin 81 mg.  9.  Tylenol 650 mg q.4 h. p.r.n.   DIET ON DISCHARGE:  No-added-salt diet.   ACTIVITY:  Activity on discharge to be gradually  increased.   CONDITION ON DISCHARGE:  Improved.           ______________________________  Alfonse Alpers Dagoberto Ligas, M.D.     CGG/MEDQ  D:  06/15/2005  T:  06/15/2005  Job:  [id number]

## 2010-12-03 NOTE — H&P (Signed)
NAMEAIDA, [name]             ACCOUNT NO.:  [id number]   MEDICAL RECORD NO.:  [id number]          PATIENT TYPE:  INP   LOCATION:  4729                         FACILITY:  [facility]   PHYSICIAN:  Reather Littler, M.D.       DATE OF BIRTH:  1923-11-24   DATE OF ADMISSION:  06/04/2005  DATE OF DISCHARGE:                                HISTORY & PHYSICAL   CHIEF COMPLAINT:  Passed out.   HISTORY:  This 77 year old lady apparently passed out last night at home.  She said she remembers getting up out of her chair to go to sleep around 10  p.m., but she found herself lying on the floor in another room, two rooms  down.  She does not remember how she got there or how she fell.  When she  tried to get up, she felt somewhat weak and had a difficult time trying to  get up, when she called for help.  She states she was also sweating somewhat  and feeling somewhat nauseous and dizzy.  She said the dizziness was more of  a lightheaded feeling.  She did not seek any help other than her family  coming in to check on her and went to bed.  During the night, she said that  when she would try to move she would feel lightheaded but not __________  of  the room or bed spinning.  She was also nauseated off and on.  In the  morning she did not feel quite right and still felt a little off balance and  spacey.  She went to the bathroom and when she tried to get up she got more  dizzy and fell down.  She did not have any episode of urinary incontinence  with this or tongue bite.  The patient says that the dizzy feeling is more  of a lightheaded or off balance feeling and it is somewhat persistent even  now.  She does not complain of any headache.  She does complain of off and  on nausea.  She said that yesterday she was able to eat, although she was  having somewhat reduced appetite and a little tired feeling from starting to  take Percocet a couple of days prior to that for her knee pain.   The patient denies any  history of focal weakness or numbness in her arms or  legs.  No history of double vision, blurred vision, difficulty swallowing,  and no associated chest pain, tightness.  She states she felt a little short  of breath after she had the syncopal episode but this was more related to  her exerting herself to get up.  '   ALLERGIES:  1.  PENICILLIN.  2.  LIPITOR.   MEDICATIONS:  1.  Toprol XL 50 mg.  2.  Maxzide 25 mg.  3.  Crestor.  4.  Percocet p.r.n.  5.  Calcium supplement.   PERSONAL HISTORY:  She is a nonsmoker.  She lives alone.   REVIEW OF SYSTEMS:  1.  The patient has a history of left knee surgery.  2.  She has had longstanding hypercholesterolemia, obesity.  3.  She had been on antihypertensive medication since 2003.  4.  She has a history of gastritis.  5.  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  6.  She has had chronic renal insufficiency with creatinine 1.7.  7.  Recently she has had more back pain and radiation down the right leg.   PHYSICAL EXAMINATION:  VITAL SIGNS:  Her blood pressure is 121/41, pulse is  80 regular.  HEENT:  Pupils are equal.  ENT exam grossly normal.  NECK:  No carotid bruits.  HEART:  Sounds are normal.  LUNGS:  Clear.  ABDOMEN:  No masses or tenderness.  EXTREMITIES:  Normal.  Pedal pulses are normal.  She has some swelling of  the left knee.  NEUROLOGIC:  There is no finger-to-nose incoordination.  The patient is  unable to stand up for gait testing.  Her ocular movements are normal.  No  facial paralysis.  She has no arm drift and no incoordination in the upper  extremities.  No nystagmus.   ASSESSMENT:  The patient has unclear episode of syncope and persistent  dizziness.   PLAN:  1.  Rule out a cerebellar stroke.  2.  Will also need to be monitored for arrhythmia as well as followup on her      cardiac enzymes and EKG since she has mild elevation of these.           ______________________________  Reather Littler,  M.D.     AK/MEDQ  D:  06/04/2005  T:  06/04/2005  Job:  [id number]   cc:   Alfonse Alpers. Dagoberto Ligas, M.D.  Fax: [phone]

## 2010-12-03 NOTE — Discharge Summary (Signed)
[name], [name]             ACCOUNT NO.:  [id number]   MEDICAL RECORD NO.:  [id number]          PATIENT TYPE:  ORB   LOCATION:  4525                         FACILITY:  [facility]   PHYSICIAN:  Alfonse Alpers. Gegick, M.D.DATE OF BIRTH:  [birth year]   DATE OF ADMISSION:  06/15/2005  DATE OF DISCHARGE:  06/21/2005                                 DISCHARGE SUMMARY   HISTORY:  This is a 77 year old woman, who presented with an episode of  syncope.  She has a contusion of her right eye, and she had an evaluation in  the Emergency Room.  She had a normal CAT scan of the head and she also had  some episodes of chest pain.  Coronary artery arteriography was performed  and this was negative.  A ventilation profusion scan was done, which showed  a pulmonary embolus.  Prior to her admission to the hospital, she had an  episode of left thigh pain, and venous Doppler studies confirmed that she  had deep venous thrombosis.   She has a history of left renal agenesis and chronic kidney disease without  a clear etiology.  She has had anemia associated with that.  She does have a  history of carcinoma of the mandible.   PHYSICAL EXAMINATION:  GENERAL:  Well-developed woman in no distress, but  was weak.  She was transferred recently from the major part of the hospital  to the [facility].  CARDIOVASCULAR:  The rhythm to be regular.  LUNGS:  Clear.   HOSPITAL COURSE:  She was admitted to the [facility] Care Unit for  improvement in exercise for her deconditioned state.  She was treated with  Coumadin.  Her INRs were therapeutic.  She increased her activity and did  well.  She did have some pain in her left shoulder and Celebrex was used  with improvement.  She also had some symptoms consistent with some shaking  and possible sepsis.  A venous PICC line was removed and cultured.  In  addition, she had a urinary tract infection that was found to be E. coli and  this was treated with Cipro.  She  continued to improve and was then  discharged.   IMPRESSION ON DISCHARGE:  1.  Pulmonary embolism with deconditioning.  2.  Deep venous thrombosis.  3.  Chronic kidney disease.  4.  Anemia secondary to chronic kidney disease.  5.  Urinary tract infection.  6.  Pain, left shoulder secondary to previous trauma.  7.  History of hypertension.   MEDICATIONS ON DISCHARGE:  1.  She will continue taking Coumadin 1 mg one daily.  2.  Enteric-coated aspirin 81 mg one daily.  3.  Feosol 325 mg one daily.  4.  Lasix 20 mg one daily.  5.  Crestor 10 mg one daily.  6.  Percocet 5/325 one every four hours p.r.n.  7.  Cipro 500 mg one b.i.d. for four days.  8.  Celebrex 200 mg one daily.   DIET ON DISCHARGE:  As tolerated.   PLAN TO FOLLOW UP:  She will be seen in the office in  a period of three  days.  Condition on discharge improved.           ______________________________  Alfonse Alpers Dagoberto Ligas, M.D.     CGG/MEDQ  D:  06/21/2005  T:  06/21/2005  Job:  [id number]

## 2010-12-03 NOTE — Cardiovascular Report (Signed)
NAMELAQUONDA, WELBY             ACCOUNT NO.:  [id number]   MEDICAL RECORD NO.:  [id number]          PATIENT TYPE:  INP   LOCATION:  4710                         FACILITY:  [facility]   PHYSICIAN:  Colleen Can. Deborah Chalk, M.D.DATE OF BIRTH:  Mar 03, 1924   DATE OF PROCEDURE:  06/07/2005  DATE OF DISCHARGE:                              CARDIAC CATHETERIZATION   PROCEDURE:  Left heart catheterization with selective coronary angiography.   TYPE AND SITE ENTRY:  Percutaneous right femoral artery with AngioSeal.   CONTRAST MATERIAL:  Omnipaque (40 cc).   MEDICATIONS:  Given prior procedure, Valium 10 milligrams.   MEDICATIONS:  Given during the procedure, none.   COMMENTS:  The patient tolerated the procedure well.   HEMODYNAMIC DATA:  The aortic pressure 112/55, LV was 124/10-16.   CORONARY ARTERIES:  1.  Left main coronary artery is normal.  2.  The left anterior descending was normal.  3.  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 However, there was a left      circumflex present and it supplied left atrial branches.  4.  His right coronary artery:  The right coronary artery was a very large      dominant vessel. It was normal.   OVERALL IMPRESSION:  1.  Normal coronary arteries.  2.  Normal left ventricular filling pressure.   DISCUSSION:  There was 40 cc of contrast given because of renal  insufficiency. The 2-D echocardiogram showed normal LV function but a  dilated right ventricle. We will need to further address pulmonary status  and consider anticoagulation. However, need to be concerned about  progressive anemia. We need to make sure that we fully exclude a pulmonary  embolus.      Colleen Can. Deborah Chalk, M.D.  Electronically Signed     SNT/MEDQ  D:  06/07/2005  T:  06/07/2005  Job:  [id number]

## 2010-12-03 NOTE — Discharge Summary (Signed)
[facility]  Patient:    [name], [name]                    MRN: [id number] Adm. Date:  [id number] Disc. Date: [id number] Attending:  Dominica Severin Dictator:   Alexzandrew L. Perkins, P.A.-C.                           Discharge Summary  ADMITTING DIAGNOSES: 1. Comminuted open fifth metatarsal fracture, left foot. 2. Osteoporosis. 3. Hypercholesterolemia.  DISCHARGE DIAGNOSES: 1. Left open fifth metatarsal fracture, type 1, status post irrigation and    debridement type 1 open left fifth metatarsal fracture with open treatment    of metatarsal fracture. 2. Osteoporosis. 3. Hypercholesterolemia.  PROCEDURE:  The patient was taken to the OR on May 05, 2000, underwent an I&D of the left open fifth tarsal metatarsal fracture. Surgeon, Dr. Dominica Severin. Spinal anesthesia.  CONSULTS:  None.  BRIEF HISTORY:  The patient is a 77 year old female who sustained a left foot twisting type injury when he right knee gave way. She had immediate onset of left foot pain with a puncture wound over the plantar aspect of the foot. The patient was brought to the [facility] Emergency Department and found to have an open fracture. Dr. Amanda Pea was oncall. The patient was seen and evaluated and admitted for surgical intervention.  LABORATORY DATA:  CBC on admission revealed a hemoglobin of 13.1, hematocrit of 39.1, white cell count 6.5, red cell count 4.16. Differential within normal limits. Chem panel all within normal limits. Urinalysis on admission showed small leukocyte esterase with only 0-5 white cells per high powered field and rare epithelial cells otherwise urinalysis was negative.  EKG dated May 04, 2000 revealed a normal sinus rhythm with occasional premature ventricular complexes, nonspecific ST segment changes confirmed by Dr. Michelle Piper Degent. Chest x-ray report on chart dated May 04, 2000 mild compression fracture lower T spine, no acute disease  in chest. This was sent over from [facility]. X-ray of the left foot on May 04, 2000 revealed left foot metatarsal shaft fracture with slight displacement. Chest x-ray dated May 04 2000 no active disease. Left foot films, these are C spot films, comminuted fracture of the fifth metatarsal is again seen as on radiographs earlier in the day. There is mild malalignment similar to the earlier film.  HOSPITAL COURSE:  The patient was admitted to [facility] for the above stated problem. She was placed at bedrest, started on IV antibiotics. She was admitted on May 04, 2000. She was taken to the OR the following late night early morning approximately at 1 a.m. on May 05, 2000 and underwent the above stated procedure. The patient tolerated the procedure well, later transferred to the recovery room and then to the orthopedic floor for continued postoperative care and IV vancomycin. The patient had some pain, however, was much improved later that day. She remained in the hospital for IV antibiotics. She was placed into a 3D boot and Cam walker for mobility. She continued to receive IV antibiotics and did well on postoperative day 1 and 2. On postoperative day 3, the wound was examined, it was clean and dry, no signs of infection. The patient was seen and evaluated by Dr. Amanda Pea. It was decided the patient would be discharged home later that day on p.o. analgesics and p.o. antibiotics. The patient was up ambulating with weightbearing as tolerated to the heel using  a Cam walker.  PLAN:  The patient was discharged home on May 07, 2000.  DISCHARGE DIAGNOSES:  Please see above.  DISCHARGE MEDICATIONS:  Cipro 500 mg p.o. b.i.d., Percocet p.r.n. pain, enteric coated aspirin 1 p.o. q.d.  DISCHARGE DIET:  As tolerated.  DISCHARGE ACTIVITY:  No weight on the forefoot, Ace wrap, up as tolerated. Elevate the foot at all times above heart. Rental wheelchair provided  for mobility.  FOLLOW-UP:  On Wednesday 3 days following discharge. Call for an appointment time at [phone].  DISPOSITION:  Home.  CONDITION ON DISCHARGE:  Improved. DD:  06/06/00 TD:  06/07/00 Job: [id number]

## 2012-09-27 ENCOUNTER — Encounter (INDEPENDENT_AMBULATORY_CARE_PROVIDER_SITE_OTHER): Payer: Medicare Other

## 2012-09-27 DIAGNOSIS — I10 Essential (primary) hypertension: Secondary | ICD-10-CM

## 2012-11-26 ENCOUNTER — Emergency Department (HOSPITAL_COMMUNITY): Payer: Medicare Other

## 2012-11-26 ENCOUNTER — Encounter (HOSPITAL_COMMUNITY): Payer: Self-pay | Admitting: Emergency Medicine

## 2012-11-26 ENCOUNTER — Inpatient Hospital Stay (HOSPITAL_COMMUNITY)
Admission: EM | Admit: 2012-11-26 | Discharge: 2012-11-29 | DRG: 602 | Disposition: A | Discharge status: Skilled Nursing Facility | Payer: Medicare Other | Attending: Internal Medicine | Admitting: Internal Medicine

## 2012-11-26 DIAGNOSIS — M48061 Spinal stenosis, lumbar region without neurogenic claudication: Secondary | ICD-10-CM | POA: Diagnosis present

## 2012-11-26 DIAGNOSIS — Z6841 Body Mass Index (BMI) 40.0 and over, adult: Secondary | ICD-10-CM

## 2012-11-26 DIAGNOSIS — E039 Hypothyroidism, unspecified: Secondary | ICD-10-CM | POA: Diagnosis present

## 2012-11-26 DIAGNOSIS — Z86711 Personal history of pulmonary embolism: Secondary | ICD-10-CM

## 2012-11-26 DIAGNOSIS — Z66 Do not resuscitate: Secondary | ICD-10-CM | POA: Diagnosis present

## 2012-11-26 DIAGNOSIS — Z880 Allergy status to penicillin: Secondary | ICD-10-CM

## 2012-11-26 DIAGNOSIS — Z79899 Other long term (current) drug therapy: Secondary | ICD-10-CM

## 2012-11-26 DIAGNOSIS — N269 Renal sclerosis, unspecified: Secondary | ICD-10-CM | POA: Diagnosis present

## 2012-11-26 DIAGNOSIS — R404 Transient alteration of awareness: Secondary | ICD-10-CM | POA: Diagnosis present

## 2012-11-26 DIAGNOSIS — Z86718 Personal history of other venous thrombosis and embolism: Secondary | ICD-10-CM

## 2012-11-26 DIAGNOSIS — N179 Acute kidney failure, unspecified: Secondary | ICD-10-CM | POA: Diagnosis present

## 2012-11-26 DIAGNOSIS — E669 Obesity, unspecified: Secondary | ICD-10-CM | POA: Diagnosis present

## 2012-11-26 DIAGNOSIS — Z7901 Long term (current) use of anticoagulants: Secondary | ICD-10-CM

## 2012-11-26 DIAGNOSIS — I959 Hypotension, unspecified: Secondary | ICD-10-CM | POA: Diagnosis present

## 2012-11-26 DIAGNOSIS — N189 Chronic kidney disease, unspecified: Secondary | ICD-10-CM | POA: Diagnosis present

## 2012-11-26 DIAGNOSIS — E875 Hyperkalemia: Secondary | ICD-10-CM | POA: Diagnosis present

## 2012-11-26 DIAGNOSIS — L02419 Cutaneous abscess of limb, unspecified: Principal | ICD-10-CM | POA: Diagnosis present

## 2012-11-26 DIAGNOSIS — G9340 Encephalopathy, unspecified: Secondary | ICD-10-CM | POA: Diagnosis present

## 2012-11-26 DIAGNOSIS — R443 Hallucinations, unspecified: Secondary | ICD-10-CM | POA: Diagnosis present

## 2012-11-26 DIAGNOSIS — L0390 Cellulitis, unspecified: Secondary | ICD-10-CM | POA: Diagnosis present

## 2012-11-26 DIAGNOSIS — Z9181 History of falling: Secondary | ICD-10-CM

## 2012-11-26 DIAGNOSIS — D649 Anemia, unspecified: Secondary | ICD-10-CM

## 2012-11-26 DIAGNOSIS — N289 Disorder of kidney and ureter, unspecified: Secondary | ICD-10-CM

## 2012-11-26 DIAGNOSIS — I10 Essential (primary) hypertension: Secondary | ICD-10-CM

## 2012-11-26 DIAGNOSIS — Z885 Allergy status to narcotic agent status: Secondary | ICD-10-CM

## 2012-11-26 DIAGNOSIS — I129 Hypertensive chronic kidney disease with stage 1 through stage 4 chronic kidney disease, or unspecified chronic kidney disease: Secondary | ICD-10-CM | POA: Diagnosis present

## 2012-11-26 HISTORY — DX: Acute embolism and thrombosis of unspecified deep veins of unspecified lower extremity: I82.409

## 2012-11-26 HISTORY — DX: Unspecified urinary incontinence: R32

## 2012-11-26 HISTORY — DX: Essential (primary) hypertension: I10

## 2012-11-26 HISTORY — DX: Hypothyroidism, unspecified: E03.9

## 2012-11-26 HISTORY — DX: Other specified soft tissue disorders: M79.89

## 2012-11-26 HISTORY — DX: Spinal stenosis, site unspecified: M48.00

## 2012-11-26 HISTORY — DX: Disorder of kidney and ureter, unspecified: N28.9

## 2012-11-26 HISTORY — DX: Other pulmonary embolism without acute cor pulmonale: I26.99

## 2012-11-26 LAB — CBC WITH DIFFERENTIAL/PLATELET
Hemoglobin: 9.8 g/dL — ABNORMAL LOW (ref 12.0–15.0)
Lymphocytes Relative: 9 % — ABNORMAL LOW (ref 12–46)
Lymphs Abs: 0.9 10*3/uL (ref 0.7–4.0)
Monocytes Relative: 6 % (ref 3–12)
Neutro Abs: 8.3 10*3/uL — ABNORMAL HIGH (ref 1.7–7.7)
Neutrophils Relative %: 83 % — ABNORMAL HIGH (ref 43–77)
RBC: 3.05 MIL/uL — ABNORMAL LOW (ref 3.87–5.11)

## 2012-11-26 LAB — HEPATIC FUNCTION PANEL
ALT: 17 U/L (ref 0–35)
AST: 28 U/L (ref 0–37)
Bilirubin, Direct: <0.1 mg/dL (ref 0.0–0.3)
Total Bilirubin: 0.2 mg/dL — ABNORMAL LOW (ref 0.3–1.2)

## 2012-11-26 LAB — URINALYSIS, ROUTINE W REFLEX MICROSCOPIC
Glucose, UA: NEGATIVE mg/dL
Hgb urine dipstick: NEGATIVE
Leukocytes, UA: NEGATIVE
Nitrite: NEGATIVE
Specific Gravity, Urine: 1.019 (ref 1.005–1.030)

## 2012-11-26 LAB — POCT I-STAT, CHEM 8
BUN: 97 mg/dL — ABNORMAL HIGH (ref 6–23)
Creatinine, Ser: 3.9 mg/dL — ABNORMAL HIGH (ref 0.50–1.10)
Glucose, Bld: 95 mg/dL (ref 70–99)
Hemoglobin: 9.5 g/dL — ABNORMAL LOW (ref 12.0–15.0)
Potassium: 5.9 mEq/L — ABNORMAL HIGH (ref 3.5–5.1)
Sodium: 136 mEq/L (ref 135–145)
TCO2: 19 mmol/L (ref 0–100)

## 2012-11-26 LAB — BASIC METABOLIC PANEL
BUN: 91 mg/dL — ABNORMAL HIGH (ref 6–23)
GFR calc non Af Amer: 8 mL/min — ABNORMAL LOW (ref >90)
Glucose, Bld: 101 mg/dL — ABNORMAL HIGH (ref 70–99)
Potassium: 5.9 mEq/L — ABNORMAL HIGH (ref 3.5–5.1)

## 2012-11-26 MED ORDER — ACETAMINOPHEN 325 MG PO TABS: 650.0000 mg | ORAL_TABLET | Freq: Four times a day (QID) | ORAL | Status: DC | PRN

## 2012-11-26 MED ORDER — METOPROLOL SUCCINATE ER 25 MG PO TB24
25.0000 mg | ORAL_TABLET | Freq: Two times a day (BID) | ORAL | Status: DC
  Administered 2012-11-26 – 2012-11-29 (×5): 25 mg via ORAL
  Filled 2012-11-26 (×7): qty 1

## 2012-11-26 MED ORDER — ONDANSETRON HCL 4 MG PO TABS: 4.0000 mg | ORAL_TABLET | Freq: Four times a day (QID) | ORAL | Status: DC | PRN

## 2012-11-26 MED ORDER — CIPROFLOXACIN IN D5W 400 MG/200ML IV SOLN
400.0000 mg | INTRAVENOUS | Status: DC
  Administered 2012-11-26: 400 mg via INTRAVENOUS
  Filled 2012-11-26 (×2): qty 200

## 2012-11-26 MED ORDER — SIMVASTATIN 20 MG PO TABS
20.0000 mg | ORAL_TABLET | Freq: Every evening | ORAL | Status: DC
  Administered 2012-11-27 – 2012-11-28 (×2): 20 mg via ORAL
  Filled 2012-11-26 (×4): qty 1

## 2012-11-26 MED ORDER — LORAZEPAM 0.5 MG PO TABS
0.2500 mg | ORAL_TABLET | Freq: Two times a day (BID) | ORAL | Status: DC | PRN
  Administered 2012-11-27: 0.5 mg via ORAL
  Filled 2012-11-26: qty 1

## 2012-11-26 MED ORDER — CLINDAMYCIN PHOSPHATE 600 MG/50ML IV SOLN
600.0000 mg | Freq: Once | INTRAVENOUS | Status: AC
  Administered 2012-11-26: 600 mg via INTRAVENOUS
  Filled 2012-11-26: qty 50

## 2012-11-26 MED ORDER — SODIUM CHLORIDE 0.9 % IJ SOLN
3.0000 mL | Freq: Two times a day (BID) | INTRAMUSCULAR | Status: DC
  Administered 2012-11-26 – 2012-11-29 (×3): 3 mL via INTRAVENOUS

## 2012-11-26 MED ORDER — VANCOMYCIN HCL IN DEXTROSE 1-5 GM/200ML-% IV SOLN
1000.0000 mg | INTRAVENOUS | Status: DC
  Administered 2012-11-26 – 2012-11-28 (×2): 1000 mg via INTRAVENOUS
  Filled 2012-11-26 (×2): qty 200

## 2012-11-26 MED ORDER — SODIUM CHLORIDE 0.9 % IV SOLN: INTRAVENOUS | Status: DC

## 2012-11-26 MED ORDER — VALACYCLOVIR HCL 500 MG PO TABS
1000.0000 mg | ORAL_TABLET | Freq: Two times a day (BID) | ORAL | Status: DC
Start: 2012-11-26 — End: 2012-11-26
  Filled 2012-11-26: qty 2

## 2012-11-26 MED ORDER — SODIUM CHLORIDE 0.9 % IV BOLUS (SEPSIS)
500.0000 mL | Freq: Once | INTRAVENOUS | Status: AC
  Administered 2012-11-26: 500 mL via INTRAVENOUS

## 2012-11-26 MED ORDER — VALACYCLOVIR HCL 500 MG PO TABS
1000.0000 mg | ORAL_TABLET | Freq: Two times a day (BID) | ORAL | Status: DC
  Filled 2012-11-26: qty 2

## 2012-11-26 MED ORDER — SODIUM CHLORIDE 0.9 % IV SOLN
INTRAVENOUS | Status: AC
  Administered 2012-11-26 – 2012-11-27 (×2): via INTRAVENOUS

## 2012-11-26 MED ORDER — ACETAMINOPHEN 650 MG RE SUPP: 650.0000 mg | Freq: Four times a day (QID) | RECTAL | Status: DC | PRN

## 2012-11-26 MED ORDER — ONDANSETRON HCL 4 MG/2ML IJ SOLN: 4.0000 mg | Freq: Four times a day (QID) | INTRAMUSCULAR | Status: DC | PRN

## 2012-11-26 MED ORDER — SERTRALINE HCL 50 MG PO TABS
50.0000 mg | ORAL_TABLET | Freq: Every day | ORAL | Status: DC
  Administered 2012-11-27 – 2012-11-29 (×3): 50 mg via ORAL
  Filled 2012-11-26 (×4): qty 1

## 2012-11-26 NOTE — ED Notes (Signed)
Pt was at [facility] for evaluation of cellulitis to the lower right leg.  Upon evaluation at their office they found pt BP to be systolic of 60's.  For EMS pt BP 116/44.  Pt alert and oriented at present, complaining of feeling tired.  Pt in a-fib for EMS, denies any hx.  EKG obtained upon arrival to ED.

## 2012-11-26 NOTE — ED Notes (Signed)
Patient transported to CT 

## 2012-11-26 NOTE — ED Notes (Signed)
Pt sent from PCP office secondary to multiple falls and bilateral cellulitis of the lower extremities. Per MD office, pt had low BP and was confused. Pt's vitals stable at this time. Alert and oriented x 4. Denies pain. Has bilateral pitting edema 2+ on both lower extremities. Pt has swelling and weeping of the lower legs. Pt is presently on Bactrim for the Cellulitis.

## 2012-11-26 NOTE — Progress Notes (Signed)
ANTIBIOTIC CONSULT NOTE - INITIAL  Pharmacy Consult for Cipro, Vancomycin, Coumadin Indication: cellulitis, history of DVT  Allergies  Allergen Reactions  . Codeine Swelling  . Penicillins Swelling    Patient Measurements: Height: 5\' 4"  (162.6 cm) Weight: 241 lb 2.9 oz (109.4 kg) IBW/kg (Calculated) : 54.7 Adjusted Body Weight:   Vital Signs: Temp: 98.4 F (36.9 C) (05/12 2139) Temp src: Oral (05/12 2139) BP: 115/84 mmHg (05/12 2139) Pulse Rate: 79 (05/12 2139) Intake/Output from previous day:   Intake/Output from this shift:    Labs:  Recent Labs  11/26/12 1720 11/26/12 1728 11/26/12 2008  WBC 9.9  --   --   HGB 9.8* 9.5*  --   PLT 163  --   --   CREATININE  --  3.90* 4.36*   Estimated Creatinine Clearance: 11.2 ml/min (by C-G formula based on Cr of 4.36). No results found for this basename: VANCOTROUGH, VANCOPEAK, VANCORANDOM, GENTTROUGH, GENTPEAK, GENTRANDOM, TOBRATROUGH, TOBRAPEAK, TOBRARND, AMIKACINPEAK, AMIKACINTROU, AMIKACIN,  in the last 72 hours   Microbiology: No results found for this or any previous visit (from the past 720 hour(s)).  Medical History: Past Medical History  Diagnosis Date  . Hypertension   . Swelling of left lower extremity chronic  . Swelling of right lower extremity chronic  . Urinary incontinence   . PE (pulmonary embolism) 2006  . DVT (deep venous thrombosis) 2006  . Hypothyroidism   . Renal disorder   . Spinal stenosis     L4, L5    Medications:  Scheduled:  . ciprofloxacin  400 mg Intravenous Q24H  . [COMPLETED] clindamycin (CLEOCIN) IV  600 mg Intravenous Once  . metoprolol succinate  25 mg Oral BID  . sertraline  50 mg Oral Daily  . simvastatin  20 mg Oral QPM  . [COMPLETED] sodium chloride  500 mL Intravenous Once  . sodium chloride  3 mL Intravenous Q12H  . vancomycin  1,000 mg Intravenous Q48H  . [DISCONTINUED] sodium chloride   Intravenous STAT  . [DISCONTINUED] valACYclovir  1,000 mg Oral BID  .  [DISCONTINUED] valACYclovir  1,000 mg Oral BID   Assessment: 79 yr old female admitted for treatment of cellulitis after increasing confusion and weakness (falls). She has recently had a 10 day course of doxycycline and is currently on Bactrim. Pt takes coumadin for history of DVT in both legs. Usually she takes 2 mg daily. She has taken dose for today. INR is a little high at 3.33. That may be due to affect of Bactrim.  Goal of Therapy:  Vancomycin trough level 10-15 mcg/ml INR 2-3   Plan:  CrCl is 11 ml/min tonight. Will need to watch for change and adjust dosages if necessary. 1)  Cipro 400 mg IV daily 2)  Vancomycin 1 Gm q48h 3)  No coumadin tonight. Pt has taken today's dose and INR is elevated at 3.33.  Eugene Garnet 11/26/2012,9:54 PM

## 2012-11-26 NOTE — Progress Notes (Signed)
Patient is alert to self and place, with some difficultly with time and no orientation to situation. She currently resides at home alone with sitters at night. Patient's daughter states that she recently has been having hallucinations. She states that her mother has a care giver at night but thinks this may not be enough and is wanting to talk with SW concerning options for placement for rehab after discharge. She has expressed interest in [facility] and [facility]. Patients skin is in tact with +2 edema to BUE, +2 to BLE, scattered ecchymosis to sacrum and BLE (daughter states these are from frequently falls) and excoriation to sacrum. Cellulitis was marked on both legs. Tele was placed and verified with CMT. Patient is oriented to the unit. Call bell is within reach. Bed is at lowest position with bed alarm on. Will continue to monitor.    Marcelyn Bruins RN

## 2012-11-26 NOTE — ED Provider Notes (Signed)
History     CSN: [id number]  Arrival date & time 11/26/12  1543   First MD Initiated Contact with Patient 11/26/12 1657      Chief Complaint  Patient presents with  . Weakness  . Cellulitis    (Consider location/radiation/quality/duration/timing/severity/associated sxs/prior treatment) HPI Comments: Patient presents with increased confusion weakness. She's had some increased pedal edema of both legs over the last month or so. They subsequently have become erythematous. She's been treated with doxycycline and a ten-day course and was recently started on Bactrim which she currently is taking. The daughter is noted that she's been increasingly confused over the last few days. The redness has been getting worse in her legs. She's also had difficulty walking and generalized weakness. She's had no vomiting or fevers. She was seen by her primary care physician today and was noted to have be hypotensive and was sent over here for evaluation. She's had a couple of recent falls due to increased weakness recently she denies any injuries from the fall. She has a history of DVT in both legs and subsequently on Coumadin.  Patient is a 79 y.o. female presenting with weakness.  Weakness Pertinent negatives include no chest pain, no abdominal pain, no headaches and no shortness of breath.    Past Medical History  Diagnosis Date  . Hypertension   . Swelling of left lower extremity chronic  . Swelling of right lower extremity chronic  . Urinary incontinence   . PE (pulmonary embolism) 2006  . DVT (deep venous thrombosis) 2006  . Hypothyroidism   . Renal disorder   . Spinal stenosis     L4, L5    No past surgical history on file.  No family history on file.  History  Substance Use Topics  . Smoking status: Not on file  . Smokeless tobacco: Not on file  . Alcohol Use: Not on file    OB History   Grav Para Term Preterm Abortions TAB SAB Ect Mult Living                  Review of  Systems  Constitutional: Positive for fatigue. Negative for fever, chills and diaphoresis.  HENT: Negative for congestion, rhinorrhea and sneezing.   Eyes: Negative.   Respiratory: Negative for cough, chest tightness and shortness of breath.   Cardiovascular: Positive for leg swelling. Negative for chest pain.  Gastrointestinal: Negative for nausea, vomiting, abdominal pain, diarrhea and blood in stool.  Genitourinary: Negative for frequency, hematuria, flank pain and difficulty urinating.  Musculoskeletal: Negative for back pain and arthralgias.  Skin: Positive for wound (abrasion to right lower leg which And several weeks ago). Negative for rash.  Neurological: Positive for weakness. Negative for dizziness, speech difficulty, numbness and headaches.  Psychiatric/Behavioral: Positive for confusion.    Allergies  Codeine and Penicillins  Home Medications   Current Outpatient Rx  Name  Route  Sig  Dispense  Refill  . acetaminophen (TYLENOL) 500 MG tablet   Oral   Take 1,000 mg by mouth every 6 (six) hours as needed for pain.         . furosemide (LASIX) 20 MG tablet   Oral   Take 20 mg by mouth daily.         Marland Kitchen LORazepam (ATIVAN) 0.5 MG tablet   Oral   Take 0.25-0.5 mg by mouth 2 (two) times daily as needed for anxiety.         . metoprolol succinate (TOPROL-XL) 25 MG 24  hr tablet   Oral   Take 25 mg by mouth 2 (two) times daily.         . potassium chloride (MICRO-K) 10 MEQ CR capsule   Oral   Take 10 mEq by mouth daily.         . ramipril (ALTACE) 10 MG capsule   Oral   Take 10 mg by mouth daily.         . sertraline (ZOLOFT) 50 MG tablet   Oral   Take 50 mg by mouth daily.         . simvastatin (ZOCOR) 20 MG tablet   Oral   Take 20 mg by mouth every evening.         . sulfamethoxazole-trimethoprim (BACTRIM DS) 800-160 MG per tablet   Oral   Take 1 tablet by mouth 2 (two) times daily.         . valACYclovir (VALTREX) 1000 MG tablet   Oral    Take 1,000 mg by mouth 2 (two) times daily.         Marland Kitchen warfarin (COUMADIN) 1 MG tablet   Oral   Take 2 mg by mouth daily.            BP 114/53  Pulse 81  Temp(Src) 98.1 F (36.7 C) (Oral)  Resp 18  SpO2 98%  Physical Exam  Constitutional: She appears well-developed and well-nourished.  HENT:  Head: Normocephalic and atraumatic.  Mouth/Throat: Oropharynx is clear and moist.  Eyes: Pupils are equal, round, and reactive to light.  Neck: Normal range of motion. Neck supple.  Cardiovascular: Normal rate, regular rhythm and normal heart sounds.   Pulmonary/Chest: Effort normal and breath sounds normal. No respiratory distress. She has no wheezes. She has no rales. She exhibits no tenderness.  Abdominal: Soft. Bowel sounds are normal. There is no tenderness. There is no rebound and no guarding.  Musculoskeletal: Normal range of motion. She exhibits edema.  Patient has marked edema in both lower legs. Her right leg is erythematous and warm up to the knee. There is a healing abrasion to the lateral aspect of the leg. There is no drainage from the site. I am unable to palpate pulses given edema.  Lymphadenopathy:    She has no cervical adenopathy.  Neurological: She is alert.  Patient is oriented to person but is confused to place and date. She does know she is in the hospital but doesn't know which one. She is moving all extremities symmetrically. There's no obvious facial drooping  Skin: Skin is warm and dry. No rash noted.  Psychiatric: She has a normal mood and affect.    ED Course  Procedures (including critical care time)  Results for orders placed during the hospital encounter of 11/26/12  CBC WITH DIFFERENTIAL      Result Value Range   WBC 9.9  4.0 - 10.5 K/uL   RBC 3.05 (*) 3.87 - 5.11 MIL/uL   Hemoglobin 9.8 (*) 12.0 - 15.0 g/dL   HCT 45.4 (*) 09.8 - 11.9 %   MCV 94.8  78.0 - 100.0 fL   MCH 32.1  26.0 - 34.0 pg   MCHC 33.9  30.0 - 36.0 g/dL   RDW 14.7  82.9 - 56.2  %   Platelets 163  150 - 400 K/uL   Neutrophils Relative 83 (*) 43 - 77 %   Neutro Abs 8.3 (*) 1.7 - 7.7 K/uL   Lymphocytes Relative 9 (*) 12 - 46 %  Lymphs Abs 0.9  0.7 - 4.0 K/uL   Monocytes Relative 6  3 - 12 %   Monocytes Absolute 0.6  0.1 - 1.0 K/uL   Eosinophils Relative 1  0 - 5 %   Eosinophils Absolute 0.1  0.0 - 0.7 K/uL   Basophils Relative 0  0 - 1 %   Basophils Absolute 0.0  0.0 - 0.1 K/uL  URINALYSIS, ROUTINE W REFLEX MICROSCOPIC      Result Value Range   Color, Urine YELLOW  YELLOW   APPearance CLEAR  CLEAR   Specific Gravity, Urine 1.019  1.005 - 1.030   pH 5.0  5.0 - 8.0   Glucose, UA NEGATIVE  NEGATIVE mg/dL   Hgb urine dipstick NEGATIVE  NEGATIVE   Bilirubin Urine NEGATIVE  NEGATIVE   Ketones, ur NEGATIVE  NEGATIVE mg/dL   Protein, ur NEGATIVE  NEGATIVE mg/dL   Urobilinogen, UA 0.2  0.0 - 1.0 mg/dL   Nitrite NEGATIVE  NEGATIVE   Leukocytes, UA NEGATIVE  NEGATIVE  LACTIC ACID, PLASMA      Result Value Range   Lactic Acid, Venous 0.9  0.5 - 2.2 mmol/L  PROTIME-INR      Result Value Range   Prothrombin Time 31.9 (*) 11.6 - 15.2 seconds   INR 3.33 (*) 0.00 - 1.49  POCT I-STAT, CHEM 8      Result Value Range   Sodium 136  135 - 145 mEq/L   Potassium 5.9 (*) 3.5 - 5.1 mEq/L   Chloride 109  96 - 112 mEq/L   BUN 97 (*) 6 - 23 mg/dL   Creatinine, Ser 1.61 (*) 0.50 - 1.10 mg/dL   Glucose, Bld 95  70 - 99 mg/dL   Calcium, Ion 0.96 (*) 1.13 - 1.30 mmol/L   TCO2 19  0 - 100 mmol/L   Hemoglobin 9.5 (*) 12.0 - 15.0 g/dL   HCT 04.5 (*) 40.9 - 81.1 %   Ct Head Wo Contrast  11/26/2012  *RADIOLOGY REPORT*  Clinical Data: Altered mental status.  Possible fall.  CT HEAD WITHOUT CONTRAST  Technique:  Contiguous axial images were obtained from the base of the skull through the vertex without contrast.  Comparison: A 06/04/2005 CT and MR.  Findings: No intracranial hemorrhage or skull fracture.  Mild global atrophy without hydrocephalus.  Mild small vessel disease type  changes. Streak artifact through the left paracentral pontine region.  No CT evidence of large acute infarct.  No intracranial mass lesion detected on this unenhanced exam.  Vascular calcifications.  IMPRESSION: No skull fracture or intracranial hemorrhage.  Small vessel disease type changes without CT evidence of large acute infarct.   Original Report Authenticated By: Lacy Duverney, M.D.      Date: 11/26/2012  Rate: 82  Rhythm: normal sinus rhythm  QRS Axis: normal  Intervals: normal  ST/T Wave abnormalities: normal  Conduction Disutrbances:none  Narrative Interpretation:   Old EKG Reviewed: none available    1. Cellulitis   2. Renal insufficiency   3. Anemia       MDM  Patient with pleuritic leg cellulitis. She has been intermittently hypotensive but currently has a normal blood pressure with normal lactate so did not appear to be consistent with sepsis. I did start her on antibiotics and will consult hospitalist for admission. There is no evidence of pneumonia or UTI. There is no evidence of intracranial hemorrhage from her recent falls. She has no neck or back pain or other apparent injuries from the fall.  Her hemoglobin is similar to her previous values. Her creatinine is increased from her last values which were in the 2 range. Her BUN is also markedly elevated so she was given IV fluids. Her attacks are mildly elevated but she doesn't have any EKG abnormalities.        Rolan Bucco, MD 11/26/12 [phone]

## 2012-11-26 NOTE — H&P (Signed)
[facility] History and Physical  [name]:[id number] DOB: 07-Jan-1924 DOA: 11/26/2012  Referring physician: ER physician. PCP: Gretel Acre, MD.  Chief Complaint: Falls.  HPI: [name] is a 79 y.o. female with history of hypertension, pulmonary embolism on Coumadin, chronic kidney disease, was brought to the ER after patient was found to have increasing falls. As per patient's daughter today when she went to pick the patient for her follow up with PCP she was found on the floor next to the stairs. Patient has per daughter has been running low blood pressure since last Friday 4 days ago. She has been having frequent falls and she tries to walk. Patient was recently placed on doxycycline for 10 days for her right leg cellulitis. This was changed to Bactrim last Friday. Patient on arrival was found to be mildly hypotensive. Blood pressure improved with fluid bolus. Metabolic panel shows worsening renal function with hyperkalemia. As per patient's daughter patient also has been getting increasingly confused. CT head was negative for anything acute patient has been in bed for further management. Patient did not have any nausea vomiting abdominal pain or diarrhea. Denies any chest pain or shortness of breath. Presently patient is alert awake oriented to time place and person.  Review of Systems: As presented in the history of presenting illness, rest negative.  Past Medical History  Diagnosis Date  . Hypertension   . Swelling of left lower extremity chronic  . Swelling of right lower extremity chronic  . Urinary incontinence   . PE (pulmonary embolism) 2006  . DVT (deep venous thrombosis) 2006  . Hypothyroidism   . Renal disorder   . Spinal stenosis     L4, L5   Past Surgical History  Procedure Laterality Date  . Abdominal hysterectomy    . Appendectomy    . Knee surgery     Social History:  reports that she has never smoked. She does not have any smokeless  tobacco history on file. She reports that she does not drink alcohol or use illicit drugs. Lives at home. where does patient live-- Not sure. Can patient participate in ADLs?  Allergies  Allergen Reactions  . Codeine Swelling  . Penicillins Swelling    Family History  Problem Relation Age of Onset  . Stroke Other       Prior to Admission medications   Medication Sig Start Date End Date Taking? Authorizing Provider  acetaminophen (TYLENOL) 500 MG tablet Take 1,000 mg by mouth every 6 (six) hours as needed for pain.   Yes Historical Provider, MD  furosemide (LASIX) 20 MG tablet Take 20 mg by mouth daily.   Yes Historical Provider, MD  LORazepam (ATIVAN) 0.5 MG tablet Take 0.25-0.5 mg by mouth 2 (two) times daily as needed for anxiety.   Yes Historical Provider, MD  metoprolol succinate (TOPROL-XL) 25 MG 24 hr tablet Take 25 mg by mouth 2 (two) times daily.   Yes Historical Provider, MD  potassium chloride (MICRO-K) 10 MEQ CR capsule Take 10 mEq by mouth daily.   Yes Historical Provider, MD  ramipril (ALTACE) 10 MG capsule Take 10 mg by mouth daily.   Yes Historical Provider, MD  sertraline (ZOLOFT) 50 MG tablet Take 50 mg by mouth daily.   Yes Historical Provider, MD  simvastatin (ZOCOR) 20 MG tablet Take 20 mg by mouth every evening.   Yes Historical Provider, MD  sulfamethoxazole-trimethoprim (BACTRIM DS) 800-160 MG per tablet Take 1 tablet by mouth 2 (two) times  daily.   Yes Historical Provider, MD  valACYclovir (VALTREX) 1000 MG tablet Take 1,000 mg by mouth 2 (two) times daily.   Yes Historical Provider, MD  warfarin (COUMADIN) 1 MG tablet Take 2 mg by mouth daily.    Yes Historical Provider, MD   Physical Exam: Filed Vitals:   11/26/12 1700 11/26/12 1800 11/26/12 1930 11/26/12 1931  BP: 89/53 118/45 114/53 114/53  Pulse: 81 80 80   Temp:      TempSrc:      Resp: 18 21 14 18   SpO2: 100% 100% 99% 98%     General:  Well-developed well-nourished.  Eyes: Anicteric no  pallor.  ENT: No discharge from the ears eyes nose and mouth.  Neck: No mass felt.  Cardiovascular: S1-S2 heard.  Respiratory: No rhonchi or crepitations.  Abdomen: Soft nontender bowel sounds present.  Skin: Chronic skin changes in both lower extremity with increased erythema on the right lower extremity with some fluid drainage.  Musculoskeletal: See skin changes.  Psychiatric: Patient presently is alert awake oriented to time place and person.  Neurologic: Moves all extremities.  Labs on Admission:  Basic Metabolic Panel:  Recent Labs Lab 11/26/12 1728  NA 136  K 5.9*  CL 109  GLUCOSE 95  BUN 97*  CREATININE 3.90*   Liver Function Tests: No results found for this basename: AST, ALT, ALKPHOS, BILITOT, PROT, ALBUMIN,  in the last 168 hours No results found for this basename: LIPASE, AMYLASE,  in the last 168 hours No results found for this basename: AMMONIA,  in the last 168 hours CBC:  Recent Labs Lab 11/26/12 1720 11/26/12 1728  WBC 9.9  --   NEUTROABS 8.3*  --   HGB 9.8* 9.5*  HCT 28.9* 28.0*  MCV 94.8  --   PLT 163  --    Cardiac Enzymes: No results found for this basename: CKTOTAL, CKMB, CKMBINDEX, TROPONINI,  in the last 168 hours  BNP (last 3 results) No results found for this basename: PROBNP,  in the last 8760 hours CBG: No results found for this basename: GLUCAP,  in the last 168 hours  Radiological Exams on Admission: Dg Chest 2 View  11/26/2012  *RADIOLOGY REPORT*  Clinical Data: Weakness, confusion  CHEST - 2 VIEW  Comparison: Portable chest x-ray of 06/09/2005 and two-view chest x- ray of 06/08/2005  Findings:  There is more opacity within the right upper lobe suggesting atelectasis.  A central endobronchial lesion cannot be excluded and CT of the chest with IV contrast media may be helpful to evaluate further.  No focal infiltrate or effusion is seen. Cardiomegaly is stable and the descending thoracic aorta is ectatic.  The bones are  osteopenic.  IMPRESSION: 1.  More opacity in the right upper lobe may reflect atelectasis. Cannot exclude a central endobronchial lesion.  Consider CT of the chest with IV contrast media.  2.  Stable cardiomegaly.   Original Report Authenticated By: Dwyane Dee, M.D.    Ct Head Wo Contrast  11/26/2012  *RADIOLOGY REPORT*  Clinical Data: Altered mental status.  Possible fall.  CT HEAD WITHOUT CONTRAST  Technique:  Contiguous axial images were obtained from the base of the skull through the vertex without contrast.  Comparison: A 06/04/2005 CT and MR.  Findings: No intracranial hemorrhage or skull fracture.  Mild global atrophy without hydrocephalus.  Mild small vessel disease type changes. Streak artifact through the left paracentral pontine region.  No CT evidence of large acute infarct.  No intracranial  mass lesion detected on this unenhanced exam.  Vascular calcifications.  IMPRESSION: No skull fracture or intracranial hemorrhage.  Small vessel disease type changes without CT evidence of large acute infarct.   Original Report Authenticated By: Lacy Duverney, M.D.     EKG: Independently reviewed. Normal sinus rhythm with lateral leads showing some peak T waves.  Assessment/Plan Principal Problem:   Cellulitis Active Problems:   Renal failure (ARF), acute on chronic   Hypotension   History of pulmonary embolism   HTN (hypertension)   Anemia   1. Hypotension probably secondary to dehydration and poor oral intake - at this time we will hold off lisinopril and continue to hydrate. 2. Cellulitis of the right lower extremity - patient has been placed on vancomycin and Cipro. Wound consult has been requested. Follow blood cultures. 3. Acute renal failure on chronic kidney disease with hyperkalemia - repeat stat metabolic panel has been ordered and if it shows persistent hyperkalemia then would treat with Kayexalate calcium D50 and insulin. Closely follow metabolic panel intake output. UA is  unremarkable. Check renal sonogram. At this time we will hold off Lasix and lisinopril. Gently hydrate. I think patient's renal failure is probably secondary to hypotension and dehydration and added on Lasix and lisinopril now. 4. Delirium and acute encephalopathy - probably related to acute metabolic derangements and infection. CT head is negative. Check urine drug screen and TSH and ammonia. If symptoms don't improve then may need MRI brain. 5. Chronic anemia - closely follow CBC. 6. History of PE on Coumadin - Coumadin per pharmacy.    Code Status: Full code.  Family Communication: Patient's daughter at the bedside.  Disposition Plan: Admit to inpatient.    Fran Mcree N. [facility] Pager [phone].  If 7PM-7AM, please contact night-coverage [email or web address] Password [facility] 11/26/2012, 9:08 PM

## 2012-11-27 ENCOUNTER — Inpatient Hospital Stay (HOSPITAL_COMMUNITY): Payer: Medicare Other

## 2012-11-27 DIAGNOSIS — I10 Essential (primary) hypertension: Secondary | ICD-10-CM

## 2012-11-27 LAB — CBC WITH DIFFERENTIAL/PLATELET
Basophils Relative: 0 % (ref 0–1)
Eosinophils Absolute: 0.2 10*3/uL (ref 0.0–0.7)
Eosinophils Relative: 2 % (ref 0–5)
Lymphs Abs: 0.7 10*3/uL (ref 0.7–4.0)
MCH: 32.5 pg (ref 26.0–34.0)
MCHC: 34 g/dL (ref 30.0–36.0)
MCV: 95.7 fL (ref 78.0–100.0)
Neutrophils Relative %: 78 % — ABNORMAL HIGH (ref 43–77)
Platelets: 151 10*3/uL (ref 150–400)
RDW: 13 % (ref 11.5–15.5)

## 2012-11-27 LAB — COMPREHENSIVE METABOLIC PANEL
ALT: 16 U/L (ref 0–35)
AST: 31 U/L (ref 0–37)
Alkaline Phosphatase: 43 U/L (ref 39–117)
CO2: 20 mEq/L (ref 19–32)
Calcium: 8.7 mg/dL (ref 8.4–10.5)
Chloride: 106 mEq/L (ref 96–112)
GFR calc Af Amer: 10 mL/min — ABNORMAL LOW (ref >90)
GFR calc non Af Amer: 9 mL/min — ABNORMAL LOW (ref >90)
Glucose, Bld: 97 mg/dL (ref 70–99)
Sodium: 138 mEq/L (ref 135–145)
Total Bilirubin: 0.2 mg/dL — ABNORMAL LOW (ref 0.3–1.2)

## 2012-11-27 LAB — BASIC METABOLIC PANEL
BUN: 80 mg/dL — ABNORMAL HIGH (ref 6–23)
Creatinine, Ser: 4.09 mg/dL — ABNORMAL HIGH (ref 0.50–1.10)
GFR calc Af Amer: 10 mL/min — ABNORMAL LOW (ref >90)
GFR calc non Af Amer: 9 mL/min — ABNORMAL LOW (ref >90)
Glucose, Bld: 168 mg/dL — ABNORMAL HIGH (ref 70–99)
Potassium: 5.2 mEq/L — ABNORMAL HIGH (ref 3.5–5.1)

## 2012-11-27 LAB — BLOOD GAS, ARTERIAL
Bicarbonate: 17.4 mEq/L — ABNORMAL LOW (ref 20.0–24.0)
Drawn by: 34762
O2 Content: 2 L/min
Patient temperature: 98.6
pH, Arterial: 7.313 — ABNORMAL LOW (ref 7.350–7.450)
pO2, Arterial: 136 mmHg — ABNORMAL HIGH (ref 80.0–100.0)

## 2012-11-27 LAB — PROTIME-INR: Prothrombin Time: 39.1 seconds — ABNORMAL HIGH (ref 11.6–15.2)

## 2012-11-27 LAB — RAPID URINE DRUG SCREEN, HOSP PERFORMED
Barbiturates: NOT DETECTED
Benzodiazepines: NOT DETECTED

## 2012-11-27 LAB — TSH: TSH: 0.621 u[IU]/mL (ref 0.350–4.500)

## 2012-11-27 MED ORDER — INSULIN ASPART 100 UNIT/ML ~~LOC~~ SOLN
10.0000 [IU] | Freq: Once | SUBCUTANEOUS | Status: AC
  Administered 2012-11-27: 10 [IU] via INTRAVENOUS

## 2012-11-27 MED ORDER — SODIUM POLYSTYRENE SULFONATE 15 GM/60ML PO SUSP
15.0000 g | Freq: Once | ORAL | Status: DC
  Filled 2012-11-27: qty 60

## 2012-11-27 MED ORDER — SODIUM CHLORIDE 0.9 % IV SOLN
1.0000 g | Freq: Once | INTRAVENOUS | Status: AC
  Administered 2012-11-27: 1 g via INTRAVENOUS
  Filled 2012-11-27: qty 10

## 2012-11-27 MED ORDER — SODIUM POLYSTYRENE SULFONATE 15 GM/60ML PO SUSP
45.0000 g | Freq: Once | ORAL | Status: AC
  Administered 2012-11-27: 45 g via ORAL
  Filled 2012-11-27: qty 180

## 2012-11-27 MED ORDER — DARBEPOETIN ALFA-POLYSORBATE 60 MCG/0.3ML IJ SOLN
60.0000 ug | INTRAMUSCULAR | Status: DC
  Filled 2012-11-27 (×2): qty 0.3

## 2012-11-27 MED ORDER — DEXTROSE 50 % IV SOLN
1.0000 | Freq: Once | INTRAVENOUS | Status: AC
  Administered 2012-11-27: 50 mL via INTRAVENOUS
  Filled 2012-11-27: qty 50

## 2012-11-27 MED ORDER — SODIUM POLYSTYRENE SULFONATE 15 GM/60ML PO SUSP
30.0000 g | Freq: Once | ORAL | Status: AC
  Administered 2012-11-27: 30 g via ORAL
  Filled 2012-11-27: qty 120

## 2012-11-27 NOTE — Progress Notes (Signed)
ANTICOAGULATION CONSULT - COUMADIN  Pharmacy Consult for : Coumadin  HPI/Indication : 79 y.o.female admitted for Cellulitis, Anemia, and Renal insufficiency is currently on Coumadin for History DVT/PE  Allergies: Allergies  Allergen Reactions  . Codeine Swelling  . Penicillins Swelling    Height/Weight: Height: 5\' 4"  (162.6 cm) Weight: 242 lb 15.2 oz (110.2 kg) IBW/kg (Calculated) : 54.7  Vitals: BP 93/55  Pulse 76  Temp(Src) 98.3 F (36.8 C) (Oral)  Resp 22  Ht 5\' 4"  (1.626 m)  Wt 242 lb 15.2 oz (110.2 kg)  BMI 41.68 kg/m2  SpO2 100%  Problems: Principal Problem:   Cellulitis Active Problems:   Renal failure (ARF), acute on chronic   Hypotension   History of pulmonary embolism   HTN (hypertension)   Anemia   Medical / Surgical History: Past Medical History  Diagnosis Date  . Hypertension   . Swelling of left lower extremity chronic  . Swelling of right lower extremity chronic  . Urinary incontinence   . PE (pulmonary embolism) 2006  . DVT (deep venous thrombosis) 2006  . Hypothyroidism   . Renal disorder   . Spinal stenosis     L4, L5   Past Surgical History  Procedure Laterality Date  . Abdominal hysterectomy    . Appendectomy    . Knee surgery      Labs:  Recent Labs  11/26/12 1715 11/26/12 1720 11/26/12 1728 11/26/12 2008 11/27/12 0500 11/27/12 0940  HGB  --  9.8* 9.5*  --  9.0*  --   HCT  --  28.9* 28.0*  --  26.5*  --   PLT  --  163  --   --  151  --   LABPROT 31.9*  --   --   --   --  39.1*  INR 3.33*  --   --   --   --  4.38*  CREATININE  --   --  3.90* 4.36* 4.25*  --    Estimated Creatinine Clearance: 11.5 ml/min (by C-G formula based on Cr of 4.25).  Pertinent Medication History: Prescriptions prior to admission  Medication Sig Dispense Refill  . acetaminophen (TYLENOL) 500 MG tablet Take 1,000 mg by mouth every 6 (six) hours as needed for pain.      . furosemide (LASIX) 20 MG tablet Take 20 mg by mouth daily.      Marland Kitchen  LORazepam (ATIVAN) 0.5 MG tablet Take 0.25-0.5 mg by mouth 2 (two) times daily as needed for anxiety.      . metoprolol succinate (TOPROL-XL) 25 MG 24 hr tablet Take 25 mg by mouth 2 (two) times daily.      . potassium chloride (MICRO-K) 10 MEQ CR capsule Take 10 mEq by mouth daily.      . ramipril (ALTACE) 10 MG capsule Take 10 mg by mouth daily.      . sertraline (ZOLOFT) 50 MG tablet Take 50 mg by mouth daily.      . simvastatin (ZOCOR) 20 MG tablet Take 20 mg by mouth every evening.      . sulfamethoxazole-trimethoprim (BACTRIM DS) 800-160 MG per tablet Take 1 tablet by mouth 2 (two) times daily.      . valACYclovir (VALTREX) 1000 MG tablet Take 1,000 mg by mouth 2 (two) times daily.      Marland Kitchen warfarin (COUMADIN) 1 MG tablet Take 2 mg by mouth daily.        Current medication:  metoprolol succinate 25 mg Oral BID  sertraline  50 mg Oral Daily  simvastatin 20 mg Oral QPM  sodium chloride 3 mL Intravenous Q12H  vancomycin 1,000 mg Intravenous Q48H  [DISCONTINUED] ciprofloxacin 400 mg Intravenous Q24H  [DISCONTINUED] valACYclovir 1,000 mg Oral BID    Assessment:  79 y/o female on chronic Coumadin at home for recent history of DVT/PE.  Her INR on admission was SUPRAtherapeutic.  She did take her daily dose of Coumadin prior to admission.  INR today continues to rise significantly, INR 4.38.  H/H/P stable with No bleeding complications noted   Goals:  Target INR of 2-3.  Plan:  Will Hold Coumadin today.    Daily INR's, CBC. Monitor for bleeding complications   Laurena Bering, Pharm. D. 11/27/2012, 2:46 PM

## 2012-11-27 NOTE — Evaluation (Addendum)
Physical Therapy Evaluation Patient Details Name: [name] MRN: [id number] DOB: 10/22/1923 Today's Date: 11/27/2012 Time: 4098-1191 PT Time Calculation (min): 25 min  PT Assessment / Plan / Recommendation Clinical Impression    Pt admitted with BLE cellulitis. Pt currently with functional limitations due to the deficits listed below (PT Problem List). Pt will benefit from skilled PT to increase their independence and safety with mobility to allow discharge to SNF      PT Assessment  Patient needs continued PT services    Follow Up Recommendations  SNF    Does the patient have the potential to tolerate intense rehabilitation      Barriers to Discharge        Equipment Recommendations  None recommended by PT    Recommendations for Other Services     Frequency Min 3X/week    Precautions / Restrictions Precautions Precautions: Fall   Pertinent Vitals/Pain See flow sheet      Mobility  Bed Mobility Bed Mobility: Supine to Sit;Sitting - Scoot to [place] of Bed;Sit to Supine;Scooting to [facility] Supine to Sit: 1: +2 Total assist Supine to Sit: Patient Percentage: 50% Sitting - Scoot to Edge of Bed: 3: Mod assist Sit to Supine: 1: +2 Total assist Sit to Supine: Patient Percentage: 50% Scooting to HOB: 1: +2 Total assist Scooting to [facility]: Patient Percentage: 0% Details for Bed Mobility Assistance: Assist to move legs and trunk Transfers Transfers: Sit to Stand;Stand to Sit Sit to Stand: 1: +2 Total assist;With upper extremity assist;From bed Sit to Stand: Patient Percentage: 80% Stand to Sit: 1: +2 Total assist;With upper extremity assist;To bed Stand to Sit: Patient Percentage: 80% Ambulation/Gait Ambulation/Gait Assistance: 1: +2 Total assist Ambulation/Gait: Patient Percentage: 80% Ambulation Distance (Feet):  (side-stepped up side of bed for 1-2 feet) Assistive device: Rolling walker Ambulation/Gait Assistance Details: verbal cues to stand more erect and to move  legs.    Exercises     PT Diagnosis: Difficulty walking;Generalized weakness  PT Problem List: Decreased strength;Decreased activity tolerance;Decreased balance;Decreased mobility;Obesity;Decreased knowledge of use of DME;Decreased knowledge of precautions PT Treatment Interventions: DME instruction;Gait training;Patient/family education;Functional mobility training;Therapeutic activities;Therapeutic exercise;Balance training   PT Goals Acute Rehab PT Goals PT Goal Formulation: With patient Time For Goal Achievement: 12/04/12 Potential to Achieve Goals: Good Pt will go Supine/Side to Sit: with min assist PT Goal: Supine/Side to Sit - Progress: Goal set today Pt will go Sit to Supine/Side: with min assist PT Goal: Sit to Supine/Side - Progress: Goal set today Pt will go Sit to Stand: with supervision PT Goal: Sit to Stand - Progress: Goal set today Pt will go Stand to Sit: with supervision PT Goal: Stand to Sit - Progress: Goal set today Pt will Ambulate: 16 - 50 feet;with supervision;with least restrictive assistive device PT Goal: Ambulate - Progress: Goal set today  Visit Information  Last PT Received On: 11/27/12 Assistance Needed: +2    Subjective Data  Subjective: "I didn't know standing could make you so tired." Patient Stated Goal: return home   Prior Functioning  Home Living Lives With: Alone Type of Home: House Home Access: Stairs to enter Entrance Stairs-Number of Steps: 4 Entrance Stairs-Rails: Right Home Layout: Two level;Able to live on main level with bedroom/bathroom Home Adaptive Equipment: Dan Humphreys - four wheeled Prior Function Comments: Difficult to assess accuracy of pt's report.  Believe was mod I with rolling walker. Communication Communication: HOH    Cognition  Cognition Arousal/Alertness: Awake/alert Behavior During Therapy: WFL for tasks assessed/performed  Overall Cognitive Status: Impaired/Different from baseline Area of Impairment:  Orientation;Memory Orientation Level: Disoriented to;Place;Situation;Time Memory: Decreased short-term memory    Extremity/Trunk Assessment Right Lower Extremity Assessment RLE ROM/Strength/Tone: Deficits RLE ROM/Strength/Tone Deficits: grossly 3/5 Left Lower Extremity Assessment LLE ROM/Strength/Tone: Deficits LLE ROM/Strength/Tone Deficits: grossly 3/5   Balance Balance Balance Assessed: Yes Static Standing Balance Static Standing - Balance Support: Bilateral upper extremity supported Static Standing - Level of Assistance: 4: Min assist Static Standing - Comment/# of Minutes: Stood x 5 minutes while trying to get orthostatics.  End of Session PT - End of Session Equipment Utilized During Treatment: Gait belt Activity Tolerance: Patient limited by fatigue Patient left: in bed;with call bell/phone within reach;with bed alarm set Nurse Communication: Mobility status  GP     [facility] 11/27/2012, 4:00 PM  [facility] PT [phone]

## 2012-11-27 NOTE — Consult Note (Signed)
[facility] Renal Consultation Note  Requesting MD: Ghimire Indication for Consultation: Acute on CRF  HPI:  [name] is a 79 y.o. female with past medical history significant for hypertension, previous PE on Coumadin therapy as well as a known atrophic left kidney and chronic kidney disease followed by Dr. Marina Gravel at [facility]. Creatinine on 10/12/2012 was noted to be 1.61. Patient was brought into the emergency department by her family for increasing falls and confusion.  She was noted to be on Bactrim for lower extremity cellulitis. Upon presentation her blood pressure was slightly low and she is on ramipril and potassium supplementation as an outpatient. She is noted to have acute on chronic renal failure and also hyperkalemia. Initial creatinine was 3.9 which worsened to 4.3 and last one was 4.2. Potassium has been between 5.5 and 6. We are asked to assist with these matters. Urine output appears low with only 300 of urine recorded since hospitalization last night.   Creatinine, Ser  Date/Time Value Range Status  11/27/2012  5:00 AM 4.25* 0.50 - 1.10 mg/dL Final  1/61/0960  4:54 PM 4.36* 0.50 - 1.10 mg/dL Final  0/98/1191  4:78 PM 3.90* 0.50 - 1.10 mg/dL Final  2/95/6213  0:86 AM 2.14* 0.4 - 1.2 mg/dL Final  5/78/4696  2:95 PM 2.29* 0.4 - 1.2 mg/dL Final  2/84/1324  4:01 AM 2.36* 0.4 - 1.2 mg/dL Final  0/27/2536  6:44 AM 2.23* 0.4 - 1.2 mg/dL Final  0/09/4740  5:95 AM 1.49* 0.4 - 1.2 mg/dL Final     PMHx:   Past Medical History  Diagnosis Date  . Hypertension   . Swelling of left lower extremity chronic  . Swelling of right lower extremity chronic  . Urinary incontinence   . PE (pulmonary embolism) 2006  . DVT (deep venous thrombosis) 2006  . Hypothyroidism   . Renal disorder   . Spinal stenosis     L4, L5    Past Surgical History  Procedure Laterality Date  . Abdominal hysterectomy    . Appendectomy    . Knee surgery       Family Hx:  Family History  Problem Relation Age of Onset  . Stroke Other     Social History:  reports that she has never smoked. She does not have any smokeless tobacco history on file. She reports that she does not drink alcohol or use illicit drugs.  Allergies:  Allergies  Allergen Reactions  . Codeine Swelling  . Penicillins Swelling    Medications: Prior to Admission medications   Medication Sig Start Date End Date Taking? Authorizing Provider  acetaminophen (TYLENOL) 500 MG tablet Take 1,000 mg by mouth every 6 (six) hours as needed for pain.   Yes Historical Provider, MD  furosemide (LASIX) 20 MG tablet Take 20 mg by mouth daily.   Yes Historical Provider, MD  LORazepam (ATIVAN) 0.5 MG tablet Take 0.25-0.5 mg by mouth 2 (two) times daily as needed for anxiety.   Yes Historical Provider, MD  metoprolol succinate (TOPROL-XL) 25 MG 24 hr tablet Take 25 mg by mouth 2 (two) times daily.   Yes Historical Provider, MD  potassium chloride (MICRO-K) 10 MEQ CR capsule Take 10 mEq by mouth daily.   Yes Historical Provider, MD  ramipril (ALTACE) 10 MG capsule Take 10 mg by mouth daily.   Yes Historical Provider, MD  sertraline (ZOLOFT) 50 MG tablet Take 50 mg by mouth daily.   Yes Historical Provider, MD  simvastatin (ZOCOR) 20 MG tablet Take 20 mg by mouth every evening.   Yes Historical Provider, MD  sulfamethoxazole-trimethoprim (BACTRIM DS) 800-160 MG per tablet Take 1 tablet by mouth 2 (two) times daily.   Yes Historical Provider, MD  valACYclovir (VALTREX) 1000 MG tablet Take 1,000 mg by mouth 2 (two) times daily.   Yes Historical Provider, MD  warfarin (COUMADIN) 1 MG tablet Take 2 mg by mouth daily.    Yes Historical Provider, MD    I have reviewed the patient's current medications.  Labs:  Results for orders placed during the hospital encounter of 11/26/12 (from the past 48 hour(s))  URINALYSIS, ROUTINE W REFLEX MICROSCOPIC     Status: None   Collection Time    11/26/12   4:43 PM      Result Value Range   Color, Urine YELLOW  YELLOW   APPearance CLEAR  CLEAR   Specific Gravity, Urine 1.019  1.005 - 1.030   pH 5.0  5.0 - 8.0   Glucose, UA NEGATIVE  NEGATIVE mg/dL   Hgb urine dipstick NEGATIVE  NEGATIVE   Bilirubin Urine NEGATIVE  NEGATIVE   Ketones, ur NEGATIVE  NEGATIVE mg/dL   Protein, ur NEGATIVE  NEGATIVE mg/dL   Urobilinogen, UA 0.2  0.0 - 1.0 mg/dL   Nitrite NEGATIVE  NEGATIVE   Leukocytes, UA NEGATIVE  NEGATIVE   Comment: MICROSCOPIC NOT DONE ON URINES WITH NEGATIVE PROTEIN, BLOOD, LEUKOCYTES, NITRITE, OR GLUCOSE <1000 mg/dL.  PROTIME-INR     Status: Abnormal   Collection Time    11/26/12  5:15 PM      Result Value Range   Prothrombin Time 31.9 (*) 11.6 - 15.2 seconds   INR 3.33 (*) 0.00 - 1.49  CBC WITH DIFFERENTIAL     Status: Abnormal   Collection Time    11/26/12  5:20 PM      Result Value Range   WBC 9.9  4.0 - 10.5 K/uL   RBC 3.05 (*) 3.87 - 5.11 MIL/uL   Hemoglobin 9.8 (*) 12.0 - 15.0 g/dL   HCT 16.1 (*) 09.6 - 04.5 %   MCV 94.8  78.0 - 100.0 fL   MCH 32.1  26.0 - 34.0 pg   MCHC 33.9  30.0 - 36.0 g/dL   RDW 40.9  81.1 - 91.4 %   Platelets 163  150 - 400 K/uL   Neutrophils Relative % 83 (*) 43 - 77 %   Neutro Abs 8.3 (*) 1.7 - 7.7 K/uL   Lymphocytes Relative 9 (*) 12 - 46 %   Lymphs Abs 0.9  0.7 - 4.0 K/uL   Monocytes Relative 6  3 - 12 %   Monocytes Absolute 0.6  0.1 - 1.0 K/uL   Eosinophils Relative 1  0 - 5 %   Eosinophils Absolute 0.1  0.0 - 0.7 K/uL   Basophils Relative 0  0 - 1 %   Basophils Absolute 0.0  0.0 - 0.1 K/uL  LACTIC ACID, PLASMA     Status: None   Collection Time    11/26/12  5:20 PM      Result Value Range   Lactic Acid, Venous 0.9  0.5 - 2.2 mmol/L  POCT I-STAT, CHEM 8     Status: Abnormal   Collection Time    11/26/12  5:28 PM      Result Value Range   Sodium 136  135 - 145 mEq/L   Potassium 5.9 (*) 3.5 - 5.1 mEq/L   Chloride 109  96 - 112 mEq/L   BUN 97 (*) 6 - 23 mg/dL   Creatinine, Ser 1.61  (*) 0.50 - 1.10 mg/dL   Glucose, Bld 95  70 - 99 mg/dL   Calcium, Ion 0.96 (*) 1.13 - 1.30 mmol/L   TCO2 19  0 - 100 mmol/L   Hemoglobin 9.5 (*) 12.0 - 15.0 g/dL   HCT 04.5 (*) 40.9 - 81.1 %  BASIC METABOLIC PANEL     Status: Abnormal   Collection Time    11/26/12  8:08 PM      Result Value Range   Sodium 137  135 - 145 mEq/L   Potassium 5.9 (*) 3.5 - 5.1 mEq/L   Comment: NO VISIBLE HEMOLYSIS   Chloride 105  96 - 112 mEq/L   CO2 20  19 - 32 mEq/L   Glucose, Bld 101 (*) 70 - 99 mg/dL   BUN 91 (*) 6 - 23 mg/dL   Creatinine, Ser 9.14 (*) 0.50 - 1.10 mg/dL   Calcium 8.7  8.4 - 78.2 mg/dL   GFR calc non Af Amer 8 (*) >90 mL/min   GFR calc Af Amer 10 (*) >90 mL/min   Comment:            The eGFR has been calculated     using the CKD EPI equation.     This calculation has not been     validated in all clinical     situations.     eGFR's persistently     <90 mL/min signify     possible Chronic Kidney Disease.  HEPATIC FUNCTION PANEL     Status: Abnormal   Collection Time    11/26/12  8:08 PM      Result Value Range   Total Protein 5.7 (*) 6.0 - 8.3 g/dL   Albumin 3.0 (*) 3.5 - 5.2 g/dL   AST 28  0 - 37 U/L   ALT 17  0 - 35 U/L   Alkaline Phosphatase 46  39 - 117 U/L   Total Bilirubin 0.2 (*) 0.3 - 1.2 mg/dL   Bilirubin, Direct <9.5  0.0 - 0.3 mg/dL   Indirect Bilirubin NOT CALCULATED  0.3 - 0.9 mg/dL  COMPREHENSIVE METABOLIC PANEL     Status: Abnormal   Collection Time    11/27/12  5:00 AM      Result Value Range   Sodium 138  135 - 145 mEq/L   Potassium 5.7 (*) 3.5 - 5.1 mEq/L   Chloride 106  96 - 112 mEq/L   CO2 20  19 - 32 mEq/L   Glucose, Bld 97  70 - 99 mg/dL   BUN 87 (*) 6 - 23 mg/dL   Creatinine, Ser 6.21 (*) 0.50 - 1.10 mg/dL   Calcium 8.7  8.4 - 30.8 mg/dL   Total Protein 5.5 (*) 6.0 - 8.3 g/dL   Albumin 2.8 (*) 3.5 - 5.2 g/dL   AST 31  0 - 37 U/L   ALT 16  0 - 35 U/L   Alkaline Phosphatase 43  39 - 117 U/L   Total Bilirubin 0.2 (*) 0.3 - 1.2 mg/dL   GFR  calc non Af Amer 9 (*) >90 mL/min   GFR calc Af Amer 10 (*) >90 mL/min   Comment:            The eGFR has been calculated     using the CKD EPI equation.     This calculation has not been  validated in all clinical     situations.     eGFR's persistently     <90 mL/min signify     possible Chronic Kidney Disease.  AMMONIA     Status: None   Collection Time    11/27/12  5:00 AM      Result Value Range   Ammonia 16  11 - 60 umol/L  CBC WITH DIFFERENTIAL     Status: Abnormal   Collection Time    11/27/12  5:00 AM      Result Value Range   WBC 7.6  4.0 - 10.5 K/uL   RBC 2.77 (*) 3.87 - 5.11 MIL/uL   Hemoglobin 9.0 (*) 12.0 - 15.0 g/dL   HCT 16.1 (*) 09.6 - 04.5 %   MCV 95.7  78.0 - 100.0 fL   MCH 32.5  26.0 - 34.0 pg   MCHC 34.0  30.0 - 36.0 g/dL   RDW 40.9  81.1 - 91.4 %   Platelets 151  150 - 400 K/uL   Neutrophils Relative % 78 (*) 43 - 77 %   Neutro Abs 5.9  1.7 - 7.7 K/uL   Lymphocytes Relative 10 (*) 12 - 46 %   Lymphs Abs 0.7  0.7 - 4.0 K/uL   Monocytes Relative 11  3 - 12 %   Monocytes Absolute 0.8  0.1 - 1.0 K/uL   Eosinophils Relative 2  0 - 5 %   Eosinophils Absolute 0.2  0.0 - 0.7 K/uL   Basophils Relative 0  0 - 1 %   Basophils Absolute 0.0  0.0 - 0.1 K/uL  TSH     Status: None   Collection Time    11/27/12  5:00 AM      Result Value Range   TSH 0.621  0.350 - 4.500 uIU/mL  BLOOD GAS, ARTERIAL     Status: Abnormal   Collection Time    11/27/12  5:45 AM      Result Value Range   O2 Content 2.0     Delivery systems NASAL CANNULA     pH, Arterial 7.313 (*) 7.350 - 7.450   pCO2 arterial 35.3  35.0 - 45.0 mmHg   pO2, Arterial 136.0 (*) 80.0 - 100.0 mmHg   Bicarbonate 17.4 (*) 20.0 - 24.0 mEq/L   TCO2 18.4  0 - 100 mmol/L   Acid-base deficit 7.7 (*) 0.0 - 2.0 mmol/L   O2 Saturation 98.7     Patient temperature 98.6     Collection site RIGHT RADIAL     Drawn by [phone]     Sample type ARTERIAL     Allens test (pass/fail) PASS  PASS  URINE RAPID DRUG  SCREEN (HOSP PERFORMED)     Status: None   Collection Time    11/27/12  5:47 AM      Result Value Range   Opiates NONE DETECTED  NONE DETECTED   Cocaine NONE DETECTED  NONE DETECTED   Benzodiazepines NONE DETECTED  NONE DETECTED   Amphetamines NONE DETECTED  NONE DETECTED   Tetrahydrocannabinol NONE DETECTED  NONE DETECTED   Barbiturates NONE DETECTED  NONE DETECTED   Comment:            DRUG SCREEN FOR MEDICAL PURPOSES     ONLY.  IF CONFIRMATION IS NEEDED     FOR ANY PURPOSE, NOTIFY LAB     WITHIN 5 DAYS.                LOWEST  DETECTABLE LIMITS     FOR URINE DRUG SCREEN     Drug Class       Cutoff (ng/mL)     Amphetamine      1000     Barbiturate      200     Benzodiazepine   200     Tricyclics       300     Opiates          300     Cocaine          300     THC              50  PROTIME-INR     Status: Abnormal   Collection Time    11/27/12  9:40 AM      Result Value Range   Prothrombin Time 39.1 (*) 11.6 - 15.2 seconds   INR 4.38 (*) 0.00 - 1.49     ROS:  Review of systems not obtained due to patient factors. Pt is too confused  Physical Exam: Filed Vitals:   11/27/12 0604  BP: 93/55  Pulse: 76  Temp: 98.3 F (36.8 C)  Resp: 22     General: Obese, elderly white female who is confused in the bed. Her daughter is at bedside. HEENT: Pupils are equal round reactive to light, extraocular motions are intact, mucous membranes are slightly dry. Neck: There is no lymphadenopathy. Jugular venous distention is difficult to assess due to body habitus Heart: Regular rate and rhythm Lungs: Poor effort. Clear to anterior exam. Abdomen: Obese, soft, nontender, nondistended.  Extremities: Patient is noted to have lymphedema. Daughter reports swelling is actually good for her. She has what appears to be bilateral cellulitic changes much worse on the right. Mostly concentrated in lotion and ankle area. There is a bandage intact.  Neuro: Patient is confused and not oriented. She  appears to be moving all 4 extremities.  Assessment/Plan: 79 year old white female with history of mild chronic kidney disease in the setting of hypertension and atrophic kidney. She now has acute on chronic renal failure  1. Renal-  acute on chronic renal failure. Normal U/A and renal ultrasound negative for obstruction in her primary kidney.   It seems to be secondary to mostly hypotension on an ACE inhibitor. Patient was also noted to be taking diuretic and Bactrim and potassium supplementation and had a recent cellulitis. She is making urine at present. The last 2 creatinines although it's difficult to determine the trend seems to be trying to improve. Medications are being held appropriately and patient is being hydrated gently. I agree with these actions. There are no indications for dialysis at this time.   2. Hypertension/volume  - still slightly dry. Been hydrated. Blood pressure medicines on hold.  I may even say for the future that ramipril is not really not a good medicine for an 79 year old to be on. 3. Hyperkalemia- this is due to Bactrim, ramipril and potassium supplementation. It should drift down as kidney function improves. Not a danger range at this time, follow closely and in AM. 4. Anemia  - due to hospitalization and CAD. We'll check iron stores and give a dose of Aranesp.  Thank for this consultation. We will follow along with you.   Yusra Ravert A 11/27/2012, 2:33 PM

## 2012-11-27 NOTE — Progress Notes (Signed)
[facility] PROGRESS NOTE  [name] WUJ:[id number] DOB: 12-27-1923 DOA: 11/26/2012 PCP: Gretel Acre, MD  Assessment/Plan:  Acute on chronic renal failure Possibly multifactorial -multiple antibiotics including Bactrim as an outpatient. On Ace-I and Lasix as an outpatient. Patient with one kidney. Requested Renal consultation  Hyperkalemia Secondary to renal failure. Received Kayexalate, insulin and D50 in the ED.  Right lower extremity cellulitis Failed Doxy and Bactrim outpatient Blood cultures pending Warfarin therapy in place so dopplers of LE not done. On Vancomycin.  Discontinued Cipro. Elevate legs.  Dizziness and falling Likely due to infection. In combination with Low BP (Per daughter was 68/35 yesterday) CT head negative. Holding Lasix Checking orthostatics PT evaluation  Hx of PE and DVT On chronic coumadin. Managed per pharmacy  Hallucinations / Delirium Patient talking with family members who are not present. Hopefully due to underlying infection - also possibly progression of dementia, or possibly undiagnosed psychosis Will continue to monitor.  Plan for further investigation if this does not clear with the infection.   DVT Prophylaxis:  warfarin  Code Status: DNR  Family Communication: daughter at bedside Disposition Plan: inpatient likely to SNF at discharge. Please discuss with Olegario Messier.  Patient prefers [facility] if possible.   Consultants:  Nephrology  Procedures:    Antibiotics:  Vanc  HPI/Subjective: Per Daughter, Patient was started on Lasix approximately 3 weeks ago.  Has been dizzy since.  Last Saturday began hallucinating.  Objective: Filed Vitals:   11/26/12 2049 11/26/12 2139 11/27/12 0448 11/27/12 0604  BP:  115/84  93/55  Pulse:  79  76  Temp:  98.4 F (36.9 C)  98.3 F (36.8 C)  TempSrc:  Oral  Oral  Resp:  24  22  Height: 5\' 4"  (1.626 m)     Weight: 109.4 kg (241 lb 2.9 oz)  110.2 kg (242 lb 15.2 oz)    SpO2:  100%  100%    Intake/Output Summary (Last 24 hours) at 11/27/12 1346 Last data filed at 11/27/12 0948  Gross per 24 hour  Intake    240 ml  Output    300 ml  Net    -60 ml   Filed Weights   11/26/12 2049 11/27/12 0448  Weight: 109.4 kg (241 lb 2.9 oz) 110.2 kg (242 lb 15.2 oz)    Exam:   General:  Pleasantly confused, NAD, +tremors, on oxygen.  Appears elderly and frail  Cardiovascular: RRR, no murmurs, rubs or gallops  Respiratory: CTA, no wheeze, crackles, or rales.  No increased work of breathing.  Abdomen: Obese Soft, non-tender, non-distended, + bowel sounds, no masses  Musculoskeletal: Able to move all 4 extremities, 5/5 strength in each  Right lower extemity Erythematous to just below the knee, swollen with 3+ edema bilaterally.  Data Reviewed: Basic Metabolic Panel:  Recent Labs Lab 11/26/12 1728 11/26/12 2008 11/27/12 0500  NA 136 137 138  K 5.9* 5.9* 5.7*  CL 109 105 106  CO2  --  20 20  GLUCOSE 95 101* 97  BUN 97* 91* 87*  CREATININE 3.90* 4.36* 4.25*  CALCIUM  --  8.7 8.7   Liver Function Tests:  Recent Labs Lab 11/26/12 2008 11/27/12 0500  AST 28 31  ALT 17 16  ALKPHOS 46 43  BILITOT 0.2* 0.2*  PROT 5.7* 5.5*  ALBUMIN 3.0* 2.8*    Recent Labs Lab 11/27/12 0500  AMMONIA 16   CBC:  Recent Labs Lab 11/26/12 1720 11/26/12 1728 11/27/12 0500  WBC 9.9  --  7.6  NEUTROABS 8.3*  --  5.9  HGB 9.8* 9.5* 9.0*  HCT 28.9* 28.0* 26.5*  MCV 94.8  --  95.7  PLT 163  --  151      Studies: Dg Chest 2 View  11/26/2012  *RADIOLOGY REPORT*  Clinical Data: Weakness, confusion  CHEST - 2 VIEW  Comparison: Portable chest x-ray of 06/09/2005 and two-view chest x- ray of 06/08/2005  Findings:  There is more opacity within the right upper lobe suggesting atelectasis.  A central endobronchial lesion cannot be excluded and CT of the chest with IV contrast media may be helpful to evaluate further.  No focal infiltrate or effusion is seen.  Cardiomegaly is stable and the descending thoracic aorta is ectatic.  The bones are osteopenic.  IMPRESSION: 1.  More opacity in the right upper lobe may reflect atelectasis. Cannot exclude a central endobronchial lesion.  Consider CT of the chest with IV contrast media.  2.  Stable cardiomegaly.   Original Report Authenticated By: Dwyane Dee, M.D.    Ct Head Wo Contrast  11/26/2012  *RADIOLOGY REPORT*  Clinical Data: Altered mental status.  Possible fall.  CT HEAD WITHOUT CONTRAST  Technique:  Contiguous axial images were obtained from the base of the skull through the vertex without contrast.  Comparison: A 06/04/2005 CT and MR.  Findings: No intracranial hemorrhage or skull fracture.  Mild global atrophy without hydrocephalus.  Mild small vessel disease type changes. Streak artifact through the left paracentral pontine region.  No CT evidence of large acute infarct.  No intracranial mass lesion detected on this unenhanced exam.  Vascular calcifications.  IMPRESSION: No skull fracture or intracranial hemorrhage.  Small vessel disease type changes without CT evidence of large acute infarct.   Original Report Authenticated By: Lacy Duverney, M.D.    US Renal  11/27/2012  *RADIOLOGY REPORT*  Clinical Data: Renal failure  RENAL/URINARY TRACT ULTRASOUND COMPLETE  Comparison:  04/07/2005  Findings:  Right Kidney:  11.1 cm in length.  Normal cortical echogenicity. No mass.  No hydronephrosis.  Left Kidney:  Absent.  Bladder:  Foley catheter in place.  Bladder contains a moderate amount of urine.  IMPRESSION: Stable absence of visualization of the left kidney.  No evidence of hydronephrosis in the right kidney.  Foley catheter is in place within the bladder.   Original Report Authenticated By: Jolaine Click, M.D.     Scheduled Meds: . ciprofloxacin  400 mg Intravenous Q24H  . metoprolol succinate  25 mg Oral BID  . sertraline  50 mg Oral Daily  . simvastatin  20 mg Oral QPM  . sodium chloride  3 mL Intravenous  Q12H  . vancomycin  1,000 mg Intravenous Q48H   Continuous Infusions: . sodium chloride 100 mL/hr at 11/27/12 1330    Principal Problem:   Cellulitis Active Problems:   Renal failure (ARF), acute on chronic   Hypotension   History of pulmonary embolism   HTN (hypertension)   Anemia    Conley Canal [facility] Pager [phone]. If 7PM-7AM, please contact night-coverage at [email or web address], password [facility] 11/27/2012, 1:46 PM  LOS: 1 day    Attending Patient seen and examined, agree with the assessment and plan as outlined above. Multifactorial renal failure-2/2 Bactrim,ACEI and Lasix all playing a role. Stop Cipro and just maintain on Vanco  S Haevyn Ury

## 2012-11-27 NOTE — Progress Notes (Signed)
Utilization review completed. Rafiel Mecca, RN, BSN. 

## 2012-11-27 NOTE — Consult Note (Signed)
WOC consult Note Reason for Consult: Lower extremity cellulitis.  Pt states she has chronic edema to BLE which became worse this week with blistering and evolved into partial thickness loss and large amt yellow drainage and cellulitis. Wound type: Right leg venous stasis partial thickness wound Pressure Ulcer POA: Not a pressure ulcer Measurement: 10cm X 10cm area to posterior leg with patchy open areas covered with yellow slough.  Areas are draining a large amount of serous fluid. 2.5cm X 1cm X 0.3cm open area to lateral portion of R leg, 100% red. Small amt of serous drainage noted from this area, no odor. Periwound: Skin to R leg is red, swollen and macerated. Dressing procedure/placement/frequency: ABD pad placed to 10cm X 10cm areas to absorb drainage and wrapped with kling. Foam dressing placed over open area to lateral leg to absorb drainage and promote healing.  Legs elevated on pillow. Norva Karvonen RN , MSN Please re-consult if further assistance is needed.  Thank-you,  Cammie Mcgee MSN, RN, CWOCN, [place], CNS [phone]

## 2012-11-28 DIAGNOSIS — D649 Anemia, unspecified: Secondary | ICD-10-CM

## 2012-11-28 LAB — CBC
Platelets: 141 10*3/uL — ABNORMAL LOW (ref 150–400)
RBC: 2.64 MIL/uL — ABNORMAL LOW (ref 3.87–5.11)
RDW: 13.3 % (ref 11.5–15.5)
WBC: 5.4 10*3/uL (ref 4.0–10.5)

## 2012-11-28 LAB — IRON AND TIBC
Saturation Ratios: 10 % — ABNORMAL LOW (ref 20–55)
TIBC: 199 ug/dL — ABNORMAL LOW (ref 250–470)

## 2012-11-28 LAB — RENAL FUNCTION PANEL
Albumin: 2.5 g/dL — ABNORMAL LOW (ref 3.5–5.2)
CO2: 22 mEq/L (ref 19–32)
Calcium: 8.3 mg/dL — ABNORMAL LOW (ref 8.4–10.5)
Chloride: 109 mEq/L (ref 96–112)
GFR calc Af Amer: 14 mL/min — ABNORMAL LOW (ref >90)
GFR calc non Af Amer: 12 mL/min — ABNORMAL LOW (ref >90)
Sodium: 140 mEq/L (ref 135–145)

## 2012-11-28 LAB — PROTIME-INR
INR: 4.05 — ABNORMAL HIGH (ref 0.00–1.49)
Prothrombin Time: 36.9 seconds — ABNORMAL HIGH (ref 11.6–15.2)

## 2012-11-28 MED ORDER — LORATADINE 10 MG PO TABS
10.0000 mg | ORAL_TABLET | Freq: Every day | ORAL | Status: DC
  Administered 2012-11-28 – 2012-11-29 (×2): 10 mg via ORAL
  Filled 2012-11-28 (×2): qty 1

## 2012-11-28 MED ORDER — WARFARIN - PHARMACIST DOSING INPATIENT: Freq: Every day | Status: DC

## 2012-11-28 NOTE — Progress Notes (Addendum)
Clinical Social Work Department CLINICAL SOCIAL WORK PLACEMENT NOTE 11/28/2012  Patient:  [name], [name]  Account Number:  [id number] Admit date:  11/26/2012  Clinical Social Worker:  Becky Sax, LCSW  Date/time:  11/28/2012 12:00 M  Clinical Social Work is seeking post-discharge placement for this patient at the following level of care:   SKILLED NURSING   (*CSW will update this form in Epic as items are completed)   11/28/2012  Patient/family provided with [facility] Department of Clinical Social Work's list of facilities offering this level of care within the geographic area requested by the patient (or if unable, by the patient's family).  11/28/2012  Patient/family informed of their freedom to choose among providers that offer the needed level of care, that participate in Medicare, Medicaid or managed care program needed by the patient, have an available bed and are willing to accept the patient.  11/28/2012  Patient/family informed of [facility]' ownership interest in [facility], as well as of the fact that they are under no obligation to receive care at this facility.  PASARR submitted to EDS on 11/28/2012 PASARR number received from EDS on 11/28/2012  FL2 transmitted to all facilities in geographic area requested by pt/family on  11/28/2012 FL2 transmitted to all facilities within larger geographic area on   Patient informed that his/her managed care company has contracts with or will negotiate with  certain facilities, including the following:     Patient/family informed of bed offers received: 11/29/12  Patient chooses bed at [facility] Physician recommends and patient chooses bed at    Patient to be transferred to [facility] on 11/27/12  Patient to be transferred to facility by ambulance  The following physician request were entered in Epic:   Additional Comments:

## 2012-11-28 NOTE — Progress Notes (Signed)
Subjective:  Pt more somnolent today- daughter at bedside-  UOP not great (wonder if they missed some) creatinine down Objective Vital signs in last 24 hours: Filed Vitals:   11/27/12 1546 11/27/12 2122 11/28/12 0447 11/28/12 0934  BP: 102/62 146/66 113/67 128/49  Pulse: 74 63 67 73  Temp:  99.1 F (37.3 C) 98.8 F (37.1 C)   TempSrc:  Oral Oral   Resp:  22 26   Height:      Weight:   110.6 kg (243 lb 13.3 oz)   SpO2:  93% 93%    Weight change: 1.2 kg (2 lb 10.3 oz)  Intake/Output Summary (Last 24 hours) at 11/28/12 1043 Last data filed at 11/28/12 0951  Gross per 24 hour  Intake 1941.67 ml  Output    400 ml  Net 1541.67 ml   Labs: Basic Metabolic Panel:  Recent Labs Lab 11/27/12 0500 11/27/12 1148 11/28/12 0700  NA 138 139 140  K 5.7* 5.2* 4.2  CL 106 108 109  CO2 20 21 22   GLUCOSE 97 168* 112*  BUN 87* 80* 67*  CREATININE 4.25* 4.09* 3.22*  CALCIUM 8.7 8.8 8.3*  PHOS  --   --  4.2   Liver Function Tests:  Recent Labs Lab 11/26/12 2008 11/27/12 0500 11/28/12 0700  AST 28 31  --   ALT 17 16  --   ALKPHOS 46 43  --   BILITOT 0.2* 0.2*  --   PROT 5.7* 5.5*  --   ALBUMIN 3.0* 2.8* 2.5*   No results found for this basename: LIPASE, AMYLASE,  in the last 168 hours  Recent Labs Lab 11/27/12 0500  AMMONIA 16   CBC:  Recent Labs Lab 11/26/12 1720 11/26/12 1728 11/27/12 0500 11/28/12 0700  WBC 9.9  --  7.6 5.4  NEUTROABS 8.3*  --  5.9  --   HGB 9.8* 9.5* 9.0* 8.5*  HCT 28.9* 28.0* 26.5* 25.5*  MCV 94.8  --  95.7 96.6  PLT 163  --  151 141*   Cardiac Enzymes: No results found for this basename: CKTOTAL, CKMB, CKMBINDEX, TROPONINI,  in the last 168 hours CBG: No results found for this basename: GLUCAP,  in the last 168 hours  Iron Studies: No results found for this basename: IRON, TIBC, TRANSFERRIN, FERRITIN,  in the last 72 hours Studies/Results: Dg Chest 2 View  11/26/2012   *RADIOLOGY REPORT*  Clinical Data: Weakness, confusion  CHEST - 2  VIEW  Comparison: Portable chest x-ray of 06/09/2005 and two-view chest x- ray of 06/08/2005  Findings:  There is more opacity within the right upper lobe suggesting atelectasis.  A central endobronchial lesion cannot be excluded and CT of the chest with IV contrast media may be helpful to evaluate further.  No focal infiltrate or effusion is seen. Cardiomegaly is stable and the descending thoracic aorta is ectatic.  The bones are osteopenic.  IMPRESSION: 1.  More opacity in the right upper lobe may reflect atelectasis. Cannot exclude a central endobronchial lesion.  Consider CT of the chest with IV contrast media.  2.  Stable cardiomegaly.   Original Report Authenticated By: Dwyane Dee, M.D.   Ct Head Wo Contrast  11/26/2012   *RADIOLOGY REPORT*  Clinical Data: Altered mental status.  Possible fall.  CT HEAD WITHOUT CONTRAST  Technique:  Contiguous axial images were obtained from the base of the skull through the vertex without contrast.  Comparison: A 06/04/2005 CT and MR.  Findings: No intracranial hemorrhage or  skull fracture.  Mild global atrophy without hydrocephalus.  Mild small vessel disease type changes. Streak artifact through the left paracentral pontine region.  No CT evidence of large acute infarct.  No intracranial mass lesion detected on this unenhanced exam.  Vascular calcifications.  IMPRESSION: No skull fracture or intracranial hemorrhage.  Small vessel disease type changes without CT evidence of large acute infarct.   Original Report Authenticated By: Lacy Duverney, M.D.   US Renal  11/27/2012   *RADIOLOGY REPORT*  Clinical Data: Renal failure  RENAL/URINARY TRACT ULTRASOUND COMPLETE  Comparison:  04/07/2005  Findings:  Right Kidney:  11.1 cm in length.  Normal cortical echogenicity. No mass.  No hydronephrosis.  Left Kidney:  Absent.  Bladder:  Foley catheter in place.  Bladder contains a moderate amount of urine.  IMPRESSION: Stable absence of visualization of the left kidney.  No evidence  of hydronephrosis in the right kidney.  Foley catheter is in place within the bladder.   Original Report Authenticated By: Jolaine Click, M.D.   Medications: Infusions:    Scheduled Medications: . darbepoetin (ARANESP) injection - NON-DIALYSIS  60 mcg Subcutaneous Q Wed-1800  . metoprolol succinate  25 mg Oral BID  . sertraline  50 mg Oral Daily  . simvastatin  20 mg Oral QPM  . sodium chloride  3 mL Intravenous Q12H  . vancomycin  1,000 mg Intravenous Q48H    have reviewed scheduled and prn medications.  Physical Exam: General: obese somnolent- but does arouse and participate in conversation Heart: RRR Lungs: mostly clear Abdomen: soft, non tender Extremities: minimal edema- cellulitis still present, possibly less red   Assessment/Plan: 79 year old white female with history of mild chronic kidney disease in the setting of hypertension and atrophic kidney. She now has acute on chronic renal failure  1. Renal- acute on chronic renal failure. Normal U/A and renal ultrasound negative for obstruction in her solitary kidney. It seems to be secondary to mostly hypotension on an ACE inhibitor. Patient was also noted to be taking diuretic and Bactrim and potassium supplementation and had a recent cellulitis. She is making urine at present although not much. Creatinine trending for the better.  Medications are being held appropriately and patient is being hydrated gently. I agree with these actions. There are no indications for dialysis at this time.  2. Hypertension/volume -  Blood pressure medicines on hold. I may even say for the future that ramipril is not really not a good medicine for an 78 year old to be on.  3. Hyperkalemia- resolved- this was due to Bactrim, ramipril and potassium supplementation.  4. Anemia - due to hospitalization and CAD. We'll check iron stores (pending)  and give a dose of Aranesp.  5. Dispo- looking at SNF.      Solace Wendorff A   11/28/2012,10:43 AM   LOS: 2 days

## 2012-11-28 NOTE — Progress Notes (Signed)
Patient wanting to go home and calling out for her daughter, patient given anti-anxiety medicine to relax her.  Will continue to monitor.  Macarthur Critchley, RN

## 2012-11-28 NOTE — Progress Notes (Signed)
ANTICOAGULATION CONSULT NOTE - Follow Up Consult  Pharmacy Consult for Coumadin Indication: DVT history  Allergies  Allergen Reactions  . Codeine Swelling  . Penicillins Swelling    Patient Measurements: Height: 5\' 4"  (162.6 cm) Weight: 243 lb 13.3 oz (110.6 kg) IBW/kg (Calculated) : 54.7  Vital Signs: Temp: 98.8 F (37.1 C) (05/14 0447) Temp src: Oral (05/14 0447) BP: 128/49 mmHg (05/14 0934) Pulse Rate: 73 (05/14 0934)  Labs:  Recent Labs  11/26/12 1720 11/26/12 1728  11/27/12 0500 11/27/12 0940 11/27/12 1148 11/28/12 0700  HGB 9.8* 9.5*  --  9.0*  --   --  8.5*  HCT 28.9* 28.0*  --  26.5*  --   --  25.5*  PLT 163  --   --  151  --   --  141*  LABPROT  --   --   --   --  39.1* 41.1* 36.9*  INR  --   --   --   --  4.38* 4.68* 4.05*  CREATININE  --  3.90*  < > 4.25*  --  4.09* 3.22*  < > = values in this interval not displayed.  Estimated Creatinine Clearance: 15.3 ml/min (by C-G formula based on Cr of 3.22).   Medications:  Scheduled:  . darbepoetin (ARANESP) injection - NON-DIALYSIS  60 mcg Subcutaneous Q Wed-1800  . metoprolol succinate  25 mg Oral BID  . sertraline  50 mg Oral Daily  . simvastatin  20 mg Oral QPM  . sodium chloride  3 mL Intravenous Q12H  . vancomycin  1,000 mg Intravenous Q48H    Assessment: 79 year old female on chronic anticoagulation with Coumadin for history of DVT.  Her INR remains supratherapeutic, likely due to antibiotic therapy and acute illness.  Note she received Bactrim as an outpatient which has a significant interaction with Coumadin.  Goal of Therapy:  INR 2-3   Plan:  No Coumadin today Check PT/INR in AM. If she is discharged recommend no Coumadin until a PT/INR recheck on 5/16.  Estella Husk, Pharm.D., BCPS Clinical Pharmacist  Phone [phone] Pager [phone] 11/28/2012, 11:07 AM

## 2012-11-28 NOTE — Progress Notes (Addendum)
[facility] PROGRESS NOTE  [name] JYN:[id number] DOB: 1923/10/05 DOA: 11/26/2012 PCP: Gretel Acre, MD  Brief history: Pt came to the hospital for increased confusion and cellulitis on her right leg. She has bilateral leg edema. She is in acute on chronic renal failure. Pt has a history of PE, HTN, and chronic renal failure. Assessment/Plan:  Acute on chronic renal failure -Possibly multifactorial -multiple antibiotics including Bactrim as an outpatient. On ACE-I and Lasix as an outpatient. -Creatinine improving.  4.09 - > 3.22, baseline is approx 2.0 -Patient with one kidney. -Sincerely appreciate Renal consultation -Pt had kidney ultra sound, which showed her absent kidney on the left and no signs of hydronephrosis on the right. -Will continue to monitor creatinine.  Ramipril discontinued.  Hyperkalemia -Secondary to renal failure. -Received Kayexalate, insulin and D50 in the ED. -Resolved.  Right lower extremity cellulitis - improving, erythema decreasing. - Failed Doxy and Bactrim outpatient - Blood cultures pending (NGTD) - Warfarin therapy in place so dopplers of LE not done. - Continue on Vancomycin.  Discontinued Cipro 5/13. - Elevate legs.  Dizziness and falling - Likely due to infection. In combination with Low BP (Per daughter was 68/35 yesterday) - CT head negative. - Holding Lasix - Checking orthostatics - PT recommends SNF  Hx of PE and DVT - On chronic coumadin. - Managed per pharmacy - Currently remains supra-theraputic  Hallucinations / Delirium - Patient not having delirium today on exam, but mildly confused. (5/14) - Patient talking with family members who are not present. (5/13) - Hopefully due to underlying infection - also possibly progression of dementia, or possibly undiagnosed psychosis - Will continue to monitor.  Plan for potential further investigation if this does not clear with the infection.    DVT Prophylaxis:   warfarin  Code Status: DNR  Family Communication: daughter at bedside yesterday, no family present this morning. Disposition Plan: inpatient likely to SNF at discharge.   Consultants:  Nephrology  Procedures:  Ultrasound of kidneys  Antibiotics:  Vanc  HPI/Subjective: Per Daughter, Patient was started on Lasix approximately 3 weeks ago.  Has been dizzy since.  Last Saturday began hallucinating. This morning she is feeling well, she denies and SOB, chest pain, head ache, nausea, vomiting, constipation or diarrhea.  Objective: Filed Vitals:   11/27/12 1535 11/27/12 1546 11/27/12 2122 11/28/12 0447  BP: 90/61 102/62 146/66 113/67  Pulse: 78 74 63 67  Temp:   99.1 F (37.3 C) 98.8 F (37.1 C)  TempSrc:   Oral Oral  Resp:   22 26  Height:      Weight:    110.6 kg (243 lb 13.3 oz)  SpO2:   93% 93%    Intake/Output Summary (Last 24 hours) at 11/28/12 0837 Last data filed at 11/28/12 0217  Gross per 24 hour  Intake 2181.67 ml  Output    400 ml  Net 1781.67 ml   Filed Weights   11/26/12 2049 11/27/12 0448 11/28/12 0447  Weight: 109.4 kg (241 lb 2.9 oz) 110.2 kg (242 lb 15.2 oz) 110.6 kg (243 lb 13.3 oz)    Exam:   General:  WDWN Pleasantly confused, NAD, +tremors, on oxygen.  Appears elderly and frail  Cardiovascular: RRR, no MGR  Respiratory: CTA Bilaterally , no wheeze, crackles, or rales.  No accessory muscle use.  Abdomen: Obese Soft, non-tender, non-distended, + bowel sounds, no masses felt  Musculoskeletal: Able to move all 4 extremities, 5/5 strength in each limb  Right lower extemity Erythema  has decreased to mid tibial area, swollen with 3+ edema bilaterally. Left leg has edema present below the knee  Data Reviewed: Basic Metabolic Panel:  Recent Labs Lab 11/26/12 1728 11/26/12 2008 11/27/12 0500 11/27/12 1148 11/28/12 0700  NA 136 137 138 139 140  K 5.9* 5.9* 5.7* 5.2* 4.2  CL 109 105 106 108 109  CO2  --  20 20 21 22   GLUCOSE 95 101* 97  168* 112*  BUN 97* 91* 87* 80* 67*  CREATININE 3.90* 4.36* 4.25* 4.09* 3.22*  CALCIUM  --  8.7 8.7 8.8 8.3*  PHOS  --   --   --   --  4.2   Liver Function Tests:  Recent Labs Lab 11/26/12 2008 11/27/12 0500 11/28/12 0700  AST 28 31  --   ALT 17 16  --   ALKPHOS 46 43  --   BILITOT 0.2* 0.2*  --   PROT 5.7* 5.5*  --   ALBUMIN 3.0* 2.8* 2.5*    Recent Labs Lab 11/27/12 0500  AMMONIA 16   CBC:  Recent Labs Lab 11/26/12 1720 11/26/12 1728 11/27/12 0500 11/28/12 0700  WBC 9.9  --  7.6 5.4  NEUTROABS 8.3*  --  5.9  --   HGB 9.8* 9.5* 9.0* 8.5*  HCT 28.9* 28.0* 26.5* 25.5*  MCV 94.8  --  95.7 96.6  PLT 163  --  151 141*      Studies: Dg Chest 2 View  11/26/2012   *RADIOLOGY REPORT*  Clinical Data: Weakness, confusion  CHEST - 2 VIEW  Comparison: Portable chest x-ray of 06/09/2005 and two-view chest x- ray of 06/08/2005  Findings:  There is more opacity within the right upper lobe suggesting atelectasis.  A central endobronchial lesion cannot be excluded and CT of the chest with IV contrast media may be helpful to evaluate further.  No focal infiltrate or effusion is seen. Cardiomegaly is stable and the descending thoracic aorta is ectatic.  The bones are osteopenic.  IMPRESSION: 1.  More opacity in the right upper lobe may reflect atelectasis. Cannot exclude a central endobronchial lesion.  Consider CT of the chest with IV contrast media.  2.  Stable cardiomegaly.   Original Report Authenticated By: Dwyane Dee, M.D.   Ct Head Wo Contrast  11/26/2012   *RADIOLOGY REPORT*  Clinical Data: Altered mental status.  Possible fall.  CT HEAD WITHOUT CONTRAST  Technique:  Contiguous axial images were obtained from the base of the skull through the vertex without contrast.  Comparison: A 06/04/2005 CT and MR.  Findings: No intracranial hemorrhage or skull fracture.  Mild global atrophy without hydrocephalus.  Mild small vessel disease type changes. Streak artifact through the left  paracentral pontine region.  No CT evidence of large acute infarct.  No intracranial mass lesion detected on this unenhanced exam.  Vascular calcifications.  IMPRESSION: No skull fracture or intracranial hemorrhage.  Small vessel disease type changes without CT evidence of large acute infarct.   Original Report Authenticated By: Lacy Duverney, M.D.   US Renal  11/27/2012   *RADIOLOGY REPORT*  Clinical Data: Renal failure  RENAL/URINARY TRACT ULTRASOUND COMPLETE  Comparison:  04/07/2005  Findings:  Right Kidney:  11.1 cm in length.  Normal cortical echogenicity. No mass.  No hydronephrosis.  Left Kidney:  Absent.  Bladder:  Foley catheter in place.  Bladder contains a moderate amount of urine.  IMPRESSION: Stable absence of visualization of the left kidney.  No evidence of hydronephrosis in the right  kidney.  Foley catheter is in place within the bladder.   Original Report Authenticated By: Jolaine Click, M.D.    Scheduled Meds: . darbepoetin (ARANESP) injection - NON-DIALYSIS  60 mcg Subcutaneous Q Wed-1800  . metoprolol succinate  25 mg Oral BID  . sertraline  50 mg Oral Daily  . simvastatin  20 mg Oral QPM  . sodium chloride  3 mL Intravenous Q12H  . vancomycin  1,000 mg Intravenous Q48H   Continuous Infusions:    Principal Problem:   Cellulitis Active Problems:   Renal failure (ARF), acute on chronic   Hypotension   History of pulmonary embolism   HTN (hypertension)   Anemia    Cherlyn Labella, PA-S Algis Downs, PA-C [facility] Pager [phone]. If 7PM-7AM, please contact night-coverage at [email or web address], password [facility] 11/28/2012, 8:37 AM  LOS: 2 days      Addendum  Patient seen and examined, chart and data base reviewed.  I agree with the above assessment and plan.  For full details please see Mrs. Algis Downs PA note.   Clint Lipps, MD [facility] Pager: [phone] 11/28/2012, 2:02 PM

## 2012-11-28 NOTE — Progress Notes (Signed)
Clinical Social Work Department BRIEF PSYCHOSOCIAL ASSESSMENT 11/28/2012  Patient:  [name], [name]     Account Number:  [id number]     Admit date:  11/26/2012  Clinical Social Worker:  Hattie Perch  Date/Time:  11/28/2012 12:00 M  Referred by:  Physician  Date Referred:  11/28/2012 Referred for  SNF Placement   Other Referral:   Interview type:  Patient Other interview type:   daughter at bedside    PSYCHOSOCIAL DATA Living Status:  FAMILY Admitted from facility:   Level of care:   Primary support name:  [name] Primary support relationship to patient:  CHILD, ADULT Degree of support available:   good    CURRENT CONCERNS Current Concerns  Post-Acute Placement   Other Concerns:    SOCIAL WORK ASSESSMENT / PLAN CSW met with patient and daughter at bedside. patient is oriented but is still clearing up after being confused. daughter confirms that patient will need snf placement upon discharge. CSW explained process. [name] states that patient had been to [facility] home when she had her hip replaced and it was not a good experience. she is requesting [facility] as first choice.   Assessment/plan status:   Other assessment/ plan:   Information/referral to community resources:    PATIENT'S/FAMILY'S RESPONSE TO PLAN OF CARE: daughter is hesitant to snf process but understands it is necessary. agreeable to patient being faxed out and recieving bed offers.

## 2012-11-29 LAB — CBC
HCT: 26.3 % — ABNORMAL LOW (ref 36.0–46.0)
MCH: 31.9 pg (ref 26.0–34.0)
MCV: 96.3 fL (ref 78.0–100.0)
Platelets: 152 10*3/uL (ref 150–400)
RBC: 2.73 MIL/uL — ABNORMAL LOW (ref 3.87–5.11)
WBC: 6.8 10*3/uL (ref 4.0–10.5)

## 2012-11-29 LAB — BASIC METABOLIC PANEL
BUN: 55 mg/dL — ABNORMAL HIGH (ref 6–23)
CO2: 18 mEq/L — ABNORMAL LOW (ref 19–32)
Calcium: 8.2 mg/dL — ABNORMAL LOW (ref 8.4–10.5)
Chloride: 109 mEq/L (ref 96–112)
Creatinine, Ser: 2.47 mg/dL — ABNORMAL HIGH (ref 0.50–1.10)
Glucose, Bld: 116 mg/dL — ABNORMAL HIGH (ref 70–99)

## 2012-11-29 MED ORDER — AMOXICILLIN-POT CLAVULANATE 500-125 MG PO TABS: 1.0000 | ORAL_TABLET | Freq: Two times a day (BID) | ORAL | Status: DC

## 2012-11-29 MED ORDER — WARFARIN SODIUM 1 MG PO TABS: 2.0000 mg | ORAL_TABLET | Freq: Every day | ORAL | Status: DC

## 2012-11-29 MED ORDER — LEVOFLOXACIN 250 MG PO TABS: 250.0000 mg | ORAL_TABLET | Freq: Every day | ORAL | Status: DC

## 2012-11-29 MED ORDER — FUROSEMIDE 20 MG PO TABS: 40.0000 mg | ORAL_TABLET | Freq: Every day | ORAL | Status: DC

## 2012-11-29 MED ORDER — ACETAMINOPHEN 500 MG PO TABS: 500.0000 mg | ORAL_TABLET | Freq: Four times a day (QID) | ORAL | Status: DC | PRN

## 2012-11-29 MED ORDER — LEVOFLOXACIN 500 MG PO TABS
500.0000 mg | ORAL_TABLET | Freq: Once | ORAL | Status: AC
  Administered 2012-11-29: 500 mg via ORAL
  Filled 2012-11-29: qty 1

## 2012-11-29 MED ORDER — LORAZEPAM 0.5 MG PO TABS: 0.2500 mg | ORAL_TABLET | Freq: Two times a day (BID) | ORAL | Status: DC | PRN

## 2012-11-29 MED ORDER — DOXYCYCLINE HYCLATE 50 MG PO CAPS: 100.0000 mg | ORAL_CAPSULE | Freq: Two times a day (BID) | ORAL | Status: DC

## 2012-11-29 MED ORDER — DOXYCYCLINE HYCLATE 100 MG PO TABS
100.0000 mg | ORAL_TABLET | Freq: Two times a day (BID) | ORAL | Status: DC
  Administered 2012-11-29: 100 mg via ORAL
  Filled 2012-11-29 (×2): qty 1

## 2012-11-29 NOTE — Progress Notes (Signed)
Subjective:  Looks so much better today- getting ready to work with PT.  When I told her who I was she remembered that Dr. Caryn Section is retiring.  UOP up and creatinine down. Daughter says that pt is being discharged to [facility] today.  Objective Vital signs in last 24 hours: Filed Vitals:   11/28/12 1544 11/28/12 2027 11/28/12 2107 11/29/12 0600  BP: 128/68 120/66 129/47 127/67  Pulse: 76 79 82 67  Temp: 98.5 F (36.9 C) 99.1 F (37.3 C) 99 F (37.2 C) 98.7 F (37.1 C)  TempSrc: Oral Oral Oral Oral  Resp: 18 20 18 22   Height:      Weight:    110.088 kg (242 lb 11.2 oz)  SpO2: 97% 93% 94% 94%   Weight change: -0.512 kg (-1 lb 2.1 oz)  Intake/Output Summary (Last 24 hours) at 11/29/12 0954 Last data filed at 11/29/12 0601  Gross per 24 hour  Intake    360 ml  Output    900 ml  Net   -540 ml   Labs: Basic Metabolic Panel:  Recent Labs Lab 11/27/12 1148 11/28/12 0700 11/29/12 0545  NA 139 140 139  K 5.2* 4.2 4.2  CL 108 109 109  CO2 21 22 18*  GLUCOSE 168* 112* 116*  BUN 80* 67* 55*  CREATININE 4.09* 3.22* 2.47*  CALCIUM 8.8 8.3* 8.2*  PHOS  --  4.2  --    Liver Function Tests:  Recent Labs Lab 11/26/12 2008 11/27/12 0500 11/28/12 0700  AST 28 31  --   ALT 17 16  --   ALKPHOS 46 43  --   BILITOT 0.2* 0.2*  --   PROT 5.7* 5.5*  --   ALBUMIN 3.0* 2.8* 2.5*   No results found for this basename: LIPASE, AMYLASE,  in the last 168 hours  Recent Labs Lab 11/27/12 0500  AMMONIA 16   CBC:  Recent Labs Lab 11/26/12 1720  11/27/12 0500 11/28/12 0700 11/29/12 0545  WBC 9.9  --  7.6 5.4 6.8  NEUTROABS 8.3*  --  5.9  --   --   HGB 9.8*  < > 9.0* 8.5* 8.7*  HCT 28.9*  < > 26.5* 25.5* 26.3*  MCV 94.8  --  95.7 96.6 96.3  PLT 163  --  151 141* 152  < > = values in this interval not displayed. Cardiac Enzymes: No results found for this basename: CKTOTAL, CKMB, CKMBINDEX, TROPONINI,  in the last 168 hours CBG: No results found for this basename: GLUCAP,  in  the last 168 hours  Iron Studies:   Recent Labs  11/28/12 0700  IRON 19*  TIBC 199*   Studies/Results: US Renal  11/27/2012   *RADIOLOGY REPORT*  Clinical Data: Renal failure  RENAL/URINARY TRACT ULTRASOUND COMPLETE  Comparison:  04/07/2005  Findings:  Right Kidney:  11.1 cm in length.  Normal cortical echogenicity. No mass.  No hydronephrosis.  Left Kidney:  Absent.  Bladder:  Foley catheter in place.  Bladder contains a moderate amount of urine.  IMPRESSION: Stable absence of visualization of the left kidney.  No evidence of hydronephrosis in the right kidney.  Foley catheter is in place within the bladder.   Original Report Authenticated By: Jolaine Click, M.D.   Medications: Infusions:    Scheduled Medications: . darbepoetin (ARANESP) injection - NON-DIALYSIS  60 mcg Subcutaneous Q Wed-1800  . loratadine  10 mg Oral Daily  . metoprolol succinate  25 mg Oral BID  . sertraline  50 mg Oral Daily  . simvastatin  20 mg Oral QPM  . sodium chloride  3 mL Intravenous Q12H  . vancomycin  1,000 mg Intravenous Q48H  . Warfarin - Pharmacist Dosing Inpatient   Does not apply q1800    have reviewed scheduled and prn medications.  Physical Exam: General: alert, back to baseline or pretty close Heart: RRR Lungs: mostly clear Abdomen: soft, non tender Extremities: minimal edema- cellulitis still present, possibly less red   Assessment/Plan: 79 year old white female with history of mild chronic kidney disease in the setting of hypertension and atrophic kidney. She now has acute on chronic renal failure  1. Renal- acute on chronic renal failure- improved.  Her baseline creatinine is in the mid 1's.  It seems to be secondary to  hypotension on an ACE inhibitor.  Creatinine trending for the better and she is much better as well. Kidney function not quite down to baseline- would rec chemistries on Monday at [facility] and forward to our office.   I would be OK with discharge- would continue to  hold ramipril and I would rec not to restart- but probably needs a little lasix at discharge - try 40 PO q day.  2. Hypertension/volume -  Stop ramipril.  3. Hyperkalemia- resolved- this was due to Bactrim, ramipril and potassium supplementation.  4. Anemia - due to hospitalization and CAD. We'll check iron stores (pending)  and give a dose of Aranesp.  5. Dispo- looking at [facility].  I have made follow up appt at [facility] for June 17th at 3:30 (to be there at 3) but will also follow labs.  Renal will sign off- call with questions     Demario Faniel A   11/29/2012,9:54 AM  LOS: 3 days

## 2012-11-29 NOTE — Discharge Summary (Signed)
Addendum  Patient seen and examined, chart and data base reviewed.  I agree with the above assessment and plan.  For full details please see Mrs. Algis Downs PA note.  Acute on chronic renal failure, right lower extremity cellulitis and declining functional status.  Patient will be discharged to nursing home on doxycycline and Levaquin.   Clint Lipps, MD [facility] Pager: [phone] 11/29/2012, 2:13 PM

## 2012-11-29 NOTE — Progress Notes (Signed)
PROGRESS NOTE   Pharmacy Consult :  Coumadin ; Vancomycin Indication: History of DVT, Cellulitis  Dosing Weight: 110 kg  Labs:  Recent Labs  11/27/12 0500  11/27/12 1148 11/28/12 0700 11/29/12 0545  HGB 9.0*  --   --  8.5* 8.7*  HCT 26.5*  --   --  25.5* 26.3*  PLT 151  --   --  141* 152  INR  --   < > 4.68* 4.05* 3.14*  CREATININE 4.25*  --  4.09* 3.22* 2.47*   Lab Results  Component Value Date   INR 3.14* 11/29/2012   INR 4.05* 11/28/2012   INR 4.68* 11/27/2012   Estimated Creatinine Clearance: 19.5 ml/min (by C-G formula based on Cr of 2.47).  Medications:  Scheduled:  . darbepoetin (ARANESP) injection - NON-DIALYSIS  60 mcg Subcutaneous Q Wed-1800  . loratadine  10 mg Oral Daily  . metoprolol succinate  25 mg Oral BID  . sertraline  50 mg Oral Daily  . simvastatin  20 mg Oral QPM  . sodium chloride  3 mL Intravenous Q12H  . vancomycin  1,000 mg Intravenous Q48H  . Warfarin - Pharmacist Dosing Inpatient   Does not apply q1800   Anti-infectives   Start     Dose/Rate Route Frequency Ordered Stop   11/26/12 2300  vancomycin (VANCOCIN) IVPB 1000 mg/200 mL premix     1,000 mg 200 mL/hr over 60 Minutes Intravenous Every 48 hours 11/26/12 2145     11/26/12 2200  valACYclovir (VALTREX) tablet 1,000 mg  Status:  Discontinued     1,000 mg Oral 2 times daily 11/26/12 2107 11/26/12 2113   11/26/12 2200  valACYclovir (VALTREX) tablet 1,000 mg  Status:  Discontinued     1,000 mg Oral 2 times daily 11/26/12 2113 11/26/12 2120   11/26/12 2200  ciprofloxacin (CIPRO) IVPB 400 mg  Status:  Discontinued     400 mg 200 mL/hr over 60 Minutes Intravenous Every 24 hours 11/26/12 2144 11/27/12 1440   11/26/12 1815  clindamycin (CLEOCIN) IVPB 600 mg     600 mg 100 mL/hr over 30 Minutes Intravenous  Once 11/26/12 1807 11/26/12 2036     Assessment:  79 y/o female admitted for treatment of cellulitis.  She has been on chronic Coumadin therapy for history of DVT.  She has remained  SUPRAtherapeutic with INR's > 3 since admission.  Renal function is improving.  Scr 4.25 > 2.47.  No bleeding complications noted with last Hgb stable at 8.7.  Goal of Therapy:  Vancomycin trough 10 - 15 mcg/ml  INR 2 - 3   Plan:   With improving renal function, will check random Vancomycin tonight and adjust Vancomycin doses as indicated.  Hold Coumadin today. Daily INR's, CBC. Monitor for bleeding complications   Laurena Bering, Pharm.D.  11/29/2012,8:51 AM

## 2012-11-29 NOTE — Care Management Note (Signed)
    Page 1 of 1   11/29/2012     11:48:43 AM   CARE MANAGEMENT NOTE 11/29/2012  Patient:  [name], [name]   Account Number:  [id number]  Date Initiated:  11/29/2012  Documentation initiated by:  Letha Cape  Subjective/Objective Assessment:   dx cellulitis, confusion, le edema  admit- lives alone     Action/Plan:   pt eval- rec snf   Anticipated DC Date:  11/29/2012   Anticipated DC Plan:  SKILLED NURSING FACILITY  In-house referral  Clinical Social Worker      DC Planning Services  CM consult      Choice offered to / List presented to:             Status of service:  Completed, signed off Medicare Important Message given?   (If response is "NO", the following Medicare IM given date fields will be blank) Date Medicare IM given:   Date Additional Medicare IM given:    Discharge Disposition:  SKILLED NURSING FACILITY  Per UR Regulation:  Reviewed for med. necessity/level of care/duration of stay  If discussed at Long Length of Stay Meetings, dates discussed:    Comments:  11/29/12 11:46 Letha Cape RN, BSN [phone] patient lives alone, per physical therapy recs snf, patient for dc to [facility] today.  CSW following.

## 2012-11-29 NOTE — Discharge Summary (Signed)
Physician Discharge Summary  [name]:[id number] DOB: 06/13/1924 DOA: 11/26/2012  PCP: Gretel Acre, MD  Admit date: 11/26/2012 Discharge date: 11/29/2012  Recommendations for Outpatient Follow-up:    INR check on May 16 am.    BMET on Monday May 19th.  Please copy results to Dr. Kathrene Bongo at [facility].  Elevate legs above the level of the heart.  Monitor right lower extremity to ensure erythema continues to recede.  Physical Therapy at SNF  Discharge Diagnoses:  Principal Problem:   Cellulitis Active Problems:   Renal failure (ARF), acute on chronic   Hypotension   History of pulmonary embolism   HTN (hypertension)   Anemia   Discharge Condition: improved.  Creatinine decreasing, erythema in right leg improving, patient much less confused.  Diet recommendation: Heart Healthy  Filed Weights   11/27/12 0448 11/28/12 0447 11/29/12 0600  Weight: 110.2 kg (242 lb 15.2 oz) 110.6 kg (243 lb 13.3 oz) 110.088 kg (242 lb 11.2 oz)    History of present illness:  [name] is a 79 y.o. female with history of hypertension, pulmonary embolism on Coumadin, chronic kidney disease, was brought to the ER after patient was found to have increasing falls. As per patient's daughter today when she went to pick the patient for her follow up with PCP she was found on the floor next to the stairs. Patient has been running low blood pressure since last Friday 4 days ago. She has been having frequent falls and she tries to walk. Patient was recently placed on doxycycline for 10 days for her right leg cellulitis. This was changed to Bactrim last Friday. Patient on arrival was found to be mildly hypotensive. Blood pressure improved with fluid bolus. Metabolic panel shows worsening renal function with hyperkalemia. As per patient's daughter patient also has been getting increasingly confused. CT head was negative for anything acute patient has been in bed for further  management. Patient did not have any nausea vomiting abdominal pain or diarrhea. Denies any chest pain or shortness of breath. Presently patient is alert awake oriented to time place and person.   Hospital Course:   Acute on chronic renal failure  -Possibly multifactorial -multiple antibiotics including Bactrim as an outpatient. On ACE-I and Lasix as an outpatient.  -Creatinine improving. 4.09 - >2.47, baseline is approx 1.5 -She received gentle hydration and nephrotoxic medications were held. -Patient with one kidney.  -Sincerely appreciate Renal consultation  -Pt had renal ultra sound, which showed her absent kidney on the left and no signs of hydronephrosis on the right.  -Ramipril discontinued.   Hyperkalemia  -Secondary to renal failure.  -Received Kayexalate, insulin and D50 in the ED.  -Resolved.   Right lower extremity cellulitis  - improving, erythema decreasing.  - Failed Doxy and Bactrim outpatient  - Treated with IV vancomycin inpatient - Will be discharged on Doxy and Levaquin (renally adjusted) as an outpatient for a total antibiotic course of 10 days. - Blood cultures from 5/12 and 5/13 are still pending, but show no growth to date - Warfarin therapy in place so dopplers of LE were not done.  - Elevate legs.   Dizziness and falling  - Likely due to infection. In combination with Low BP (Per daughter was 68/35 just prior to admission)  - CT head negative.  - Physical therapy recommends SNF for rehabilitation.  Hx of PE and DVT on chronic anticoagulation - On chronic coumadin, however coumadin is being held - Currently remains supra-theraputic (  possiblly due to preadmission antibiotics). - Request [facility] check INR on 5/16 and dose coumadin appropriately.    Hallucinations / Delirium  -Resolved.  Likely due to underlying infection exacerbating dementia. - Patient talking with family members who are not present. (5/13)     Consultations:  Nephrology  Discharge Exam: Filed Vitals:   11/28/12 2027 11/28/12 2107 11/29/12 0600 11/29/12 1010  BP: 120/66 129/47 127/67   Pulse: 79 82 67   Temp: 99.1 F (37.3 C) 99 F (37.2 C) 98.7 F (37.1 C)   TempSrc: Oral Oral Oral   Resp: 20 18 22    Height:      Weight:   110.088 kg (242 lb 11.2 oz)   SpO2: 93% 94% 94% 93%   General: WDWN, NAD, +tremors, on oxygen. Appears elderly and frail, less confused today. Cardiovascular: RRR, no MGR  Respiratory: CTA Bilaterally , no wheeze, crackles, or rales. No accessory muscle use.  Abdomen: Obese Soft, non-tender, non-distended, + bowel sounds, no masses felt  Musculoskeletal: Able to move all 4 extremities, 5/5 strength in each limb Right lower extemity Erythema has decreased to mid tibial area, swollen with 3+ edema bilaterally. Left leg has edema present below the knee   Discharge Instructions      Discharge Orders   Future Orders Complete By Expires     Diet - low sodium heart healthy  As directed     Increase activity slowly  As directed         Medication List    STOP taking these medications       ramipril 10 MG capsule  Commonly known as:  ALTACE     sulfamethoxazole-trimethoprim 800-160 MG per tablet  Commonly known as:  BACTRIM DS      TAKE these medications       acetaminophen 500 MG tablet  Commonly known as:  TYLENOL  Take 1 tablet (500 mg total) by mouth every 6 (six) hours as needed for pain.     doxycycline 50 MG capsule  Commonly known as:  VIBRAMYCIN  Take 2 capsules (100 mg total) by mouth 2 (two) times daily.     furosemide 20 MG tablet  Commonly known as:  LASIX  Take 2 tablets (40 mg total) by mouth daily.     levofloxacin 250 MG tablet  Commonly known as:  LEVAQUIN  Take 1 tablet (250 mg total) by mouth daily.  Start taking on:  11/30/2012     LORazepam 0.5 MG tablet  Commonly known as:  ATIVAN  Take 0.5 tablets (0.25 mg total) by mouth 2 (two) times daily as  needed for anxiety.     metoprolol succinate 25 MG 24 hr tablet  Commonly known as:  TOPROL-XL  Take 25 mg by mouth 2 (two) times daily.     potassium chloride 10 MEQ CR capsule  Commonly known as:  MICRO-K  Take 10 mEq by mouth daily.     sertraline 50 MG tablet  Commonly known as:  ZOLOFT  Take 50 mg by mouth daily.     simvastatin 20 MG tablet  Commonly known as:  ZOCOR  Take 20 mg by mouth every evening.     valACYclovir 1000 MG tablet  Commonly known as:  VALTREX  Take 1,000 mg by mouth 2 (two) times daily.     warfarin 1 MG tablet  Commonly known as:  COUMADIN  Take 2 tablets (2 mg total) by mouth daily. INR LEVEL SUPRA THERAPUTIC.  Hold  Warfarin for now.  Check INR on 5/16 and restart when appropriate.       Allergies  Allergen Reactions  . Penicillins Anaphylaxis  . Codeine Swelling   Follow-up Information   Follow up with Cecille Aver, MD On 01/01/2013. (arrive at 3:00 for a 3:30 appointment)    Contact information:   [address] [place] [address] [phone]       Please follow up. (INR CHECK ON 5/16 .)        The results of significant diagnostics from this hospitalization (including imaging, microbiology, ancillary and laboratory) are listed below for reference.    Significant Diagnostic Studies: Dg Chest 2 View  11/26/2012   *RADIOLOGY REPORT*  Clinical Data: Weakness, confusion  CHEST - 2 VIEW  Comparison: Portable chest x-ray of 06/09/2005 and two-view chest x- ray of 06/08/2005  Findings:  There is more opacity within the right upper lobe suggesting atelectasis.  A central endobronchial lesion cannot be excluded and CT of the chest with IV contrast media may be helpful to evaluate further.  No focal infiltrate or effusion is seen. Cardiomegaly is stable and the descending thoracic aorta is ectatic.  The bones are osteopenic.  IMPRESSION: 1.  More opacity in the right upper lobe may reflect atelectasis. Cannot exclude a central endobronchial  lesion.  Consider CT of the chest with IV contrast media.  2.  Stable cardiomegaly.   Original Report Authenticated By: Dwyane Dee, M.D.   Ct Head Wo Contrast  11/26/2012   *RADIOLOGY REPORT*  Clinical Data: Altered mental status.  Possible fall.  CT HEAD WITHOUT CONTRAST  Technique:  Contiguous axial images were obtained from the base of the skull through the vertex without contrast.  Comparison: A 06/04/2005 CT and MR.  Findings: No intracranial hemorrhage or skull fracture.  Mild global atrophy without hydrocephalus.  Mild small vessel disease type changes. Streak artifact through the left paracentral pontine region.  No CT evidence of large acute infarct.  No intracranial mass lesion detected on this unenhanced exam.  Vascular calcifications.  IMPRESSION: No skull fracture or intracranial hemorrhage.  Small vessel disease type changes without CT evidence of large acute infarct.   Original Report Authenticated By: Lacy Duverney, M.D.   US Renal  11/27/2012   *RADIOLOGY REPORT*  Clinical Data: Renal failure  RENAL/URINARY TRACT ULTRASOUND COMPLETE  Comparison:  04/07/2005  Findings:  Right Kidney:  11.1 cm in length.  Normal cortical echogenicity. No mass.  No hydronephrosis.  Left Kidney:  Absent.  Bladder:  Foley catheter in place.  Bladder contains a moderate amount of urine.  IMPRESSION: Stable absence of visualization of the left kidney.  No evidence of hydronephrosis in the right kidney.  Foley catheter is in place within the bladder.   Original Report Authenticated By: Jolaine Click, M.D.    Microbiology: Recent Results (from the past 240 hour(s))  CULTURE, BLOOD (ROUTINE X 2)     Status: None   Collection Time    11/26/12  5:00 PM      Result Value Range Status   Specimen Description BLOOD ARM LEFT   Final   Special Requests BOTTLES DRAWN AEROBIC ONLY 4CC   Final   Culture  Setup Time 11/27/2012 00:45   Final   Culture     Final   Value:        BLOOD CULTURE RECEIVED NO GROWTH TO DATE  CULTURE WILL BE HELD FOR 5 DAYS BEFORE ISSUING A FINAL NEGATIVE REPORT   Report  Status PENDING   Incomplete  CULTURE, BLOOD (ROUTINE X 2)     Status: None   Collection Time    11/26/12  5:05 PM      Result Value Range Status   Specimen Description BLOOD HAND LEFT   Final   Special Requests BOTTLES DRAWN AEROBIC ONLY 3CC   Final   Culture  Setup Time 11/27/2012 00:44   Final   Culture     Final   Value:        BLOOD CULTURE RECEIVED NO GROWTH TO DATE CULTURE WILL BE HELD FOR 5 DAYS BEFORE ISSUING A FINAL NEGATIVE REPORT   Report Status PENDING   Incomplete     Labs: Basic Metabolic Panel:  Recent Labs Lab 11/26/12 2008 11/27/12 0500 11/27/12 1148 11/28/12 0700 11/29/12 0545  NA 137 138 139 140 139  K 5.9* 5.7* 5.2* 4.2 4.2  CL 105 106 108 109 109  CO2 20 20 21 22  18*  GLUCOSE 101* 97 168* 112* 116*  BUN 91* 87* 80* 67* 55*  CREATININE 4.36* 4.25* 4.09* 3.22* 2.47*  CALCIUM 8.7 8.7 8.8 8.3* 8.2*  PHOS  --   --   --  4.2  --    Liver Function Tests:  Recent Labs Lab 11/26/12 2008 11/27/12 0500 11/28/12 0700  AST 28 31  --   ALT 17 16  --   ALKPHOS 46 43  --   BILITOT 0.2* 0.2*  --   PROT 5.7* 5.5*  --   ALBUMIN 3.0* 2.8* 2.5*    Recent Labs Lab 11/27/12 0500  AMMONIA 16   CBC:  Recent Labs Lab 11/26/12 1720 11/26/12 1728 11/27/12 0500 11/28/12 0700 11/29/12 0545  WBC 9.9  --  7.6 5.4 6.8  NEUTROABS 8.3*  --  5.9  --   --   HGB 9.8* 9.5* 9.0* 8.5* 8.7*  HCT 28.9* 28.0* 26.5* 25.5* 26.3*  MCV 94.8  --  95.7 96.6 96.3  PLT 163  --  151 141* 152      Signed:  Stephani Police, PA-C  [facility] 11/29/2012, 12:03 PM

## 2012-11-29 NOTE — Progress Notes (Signed)
Steffanie Rainwater to be D/C'd Skilled nursing facility per MD order.  Discussed prescriptions and follow up appointments with the patient. Prescriptions given to patient, medication list explained in detail. Pt verbalized understanding.    Medication List    STOP taking these medications       ramipril 10 MG capsule  Commonly known as:  ALTACE     sulfamethoxazole-trimethoprim 800-160 MG per tablet  Commonly known as:  BACTRIM DS      TAKE these medications       acetaminophen 500 MG tablet  Commonly known as:  TYLENOL  Take 1 tablet (500 mg total) by mouth every 6 (six) hours as needed for pain.     doxycycline 50 MG capsule  Commonly known as:  VIBRAMYCIN  Take 2 capsules (100 mg total) by mouth 2 (two) times daily.     furosemide 20 MG tablet  Commonly known as:  LASIX  Take 2 tablets (40 mg total) by mouth daily.     levofloxacin 250 MG tablet  Commonly known as:  LEVAQUIN  Take 1 tablet (250 mg total) by mouth daily.  Start taking on:  11/30/2012     LORazepam 0.5 MG tablet  Commonly known as:  ATIVAN  Take 0.5 tablets (0.25 mg total) by mouth 2 (two) times daily as needed for anxiety.     metoprolol succinate 25 MG 24 hr tablet  Commonly known as:  TOPROL-XL  Take 25 mg by mouth 2 (two) times daily.     potassium chloride 10 MEQ CR capsule  Commonly known as:  MICRO-K  Take 10 mEq by mouth daily.     sertraline 50 MG tablet  Commonly known as:  ZOLOFT  Take 50 mg by mouth daily.     simvastatin 20 MG tablet  Commonly known as:  ZOCOR  Take 20 mg by mouth every evening.     valACYclovir 1000 MG tablet  Commonly known as:  VALTREX  Take 1,000 mg by mouth 2 (two) times daily.     warfarin 1 MG tablet  Commonly known as:  COUMADIN  Take 2 tablets (2 mg total) by mouth daily. INR LEVEL SUPRA THERAPUTIC.  Hold Warfarin for now.  Check INR on 5/16 and restart when appropriate.        Filed Vitals:   11/29/12 0600  BP: 127/67  Pulse: 67  Temp: 98.7 F  (37.1 C)  Resp: 22    Bilateral lower extremities - cellulitis, right leg has open ulcers covered with dressings. No complaints of pain. Foley discontinued prior to discharge. Gross scattered bruising to all four extremities. IV d/ced- sign dry, clean, and intact. Sacrum slightly red but blanchable. Discharge information to be sent with [facility]. Will monitor until discharge.  Driggers, Rae Roam 11/29/2012 3:15 PM

## 2012-11-29 NOTE — Progress Notes (Signed)
Physical Therapy Treatment Patient Details Name: [name] MRN: [id number] DOB: [birth year] Today's Date: 11/29/2012 Time: 1478-2956 PT Time Calculation (min): 27 min  PT Assessment / Plan / Recommendation Comments on Treatment Session  Pt adm with cellulitis of legs.  Pt more alert today and making good progress with mobility.    Follow Up Recommendations  SNF     Does the patient have the potential to tolerate intense rehabilitation     Barriers to Discharge        Equipment Recommendations  None recommended by PT    Recommendations for Other Services    Frequency Min 2X/week   Plan Discharge plan remains appropriate;Frequency needs to be updated    Precautions / Restrictions Precautions Precautions: Fall Restrictions Weight Bearing Restrictions: No   Pertinent Vitals/Pain SaO2 93% on RA.    Mobility  Bed Mobility Supine to Sit: 4: Min assist Sitting - Scoot to Edge of Bed: 4: Min assist Details for Bed Mobility Assistance: Assist to move legs and trunk Transfers Sit to Stand: 4: Min assist;With upper extremity assist;With armrests;From bed;From chair/3-in-1 Stand to Sit: 4: Min assist;With upper extremity assist;With armrests;To chair/3-in-1 Ambulation/Gait Ambulation/Gait Assistance: 4: Min assist Ambulation Distance (Feet): 90 Feet Assistive device: Rolling walker Ambulation/Gait Assistance Details: verbal cues to stand more erect Gait Pattern: Step-through pattern;Decreased step length - right;Decreased step length - left;Trunk flexed;Wide base of support Gait velocity: decr    Exercises     PT Diagnosis:    PT Problem List:   PT Treatment Interventions:     PT Goals Acute Rehab PT Goals Pt will go Supine/Side to Sit: with supervision PT Goal: Supine/Side to Sit - Progress: Updated due to goal met PT Goal: Sit to Stand - Progress: Progressing toward goal PT Goal: Stand to Sit - Progress: Progressing toward goal PT Goal: Ambulate - Progress:  Progressing toward goal  Visit Information  Last PT Received On: 11/29/12 Assistance Needed: +1    Subjective Data  Subjective: Pt states she feels better.   Cognition  Cognition Arousal/Alertness: Awake/alert Behavior During Therapy: WFL for tasks assessed/performed Overall Cognitive Status: Within Functional Limits for tasks assessed    Balance  Static Standing Balance Static Standing - Balance Support: Bilateral upper extremity supported Static Standing - Level of Assistance: 4: Min assist  End of Session PT - End of Session Equipment Utilized During Treatment: Gait belt Activity Tolerance: Patient tolerated treatment well Patient left: in chair;with call bell/phone within reach;with family/visitor present Nurse Communication: Mobility status   GP     [name] 11/29/2012, 10:55 AM  [employer] PT [phone]

## 2012-12-03 LAB — CULTURE, BLOOD (ROUTINE X 2): Culture: NO GROWTH

## 2012-12-04 ENCOUNTER — Non-Acute Institutional Stay (SKILLED_NURSING_FACILITY): Payer: Medicare Other | Admitting: Internal Medicine

## 2012-12-04 DIAGNOSIS — N179 Acute kidney failure, unspecified: Secondary | ICD-10-CM

## 2012-12-04 DIAGNOSIS — Z86711 Personal history of pulmonary embolism: Secondary | ICD-10-CM

## 2012-12-04 DIAGNOSIS — L02419 Cutaneous abscess of limb, unspecified: Secondary | ICD-10-CM

## 2012-12-04 DIAGNOSIS — I80299 Phlebitis and thrombophlebitis of other deep vessels of unspecified lower extremity: Secondary | ICD-10-CM

## 2012-12-04 DIAGNOSIS — L03119 Cellulitis of unspecified part of limb: Secondary | ICD-10-CM

## 2012-12-04 DIAGNOSIS — N189 Chronic kidney disease, unspecified: Secondary | ICD-10-CM

## 2012-12-04 DIAGNOSIS — I150 Renovascular hypertension: Secondary | ICD-10-CM

## 2012-12-04 LAB — CULTURE, BLOOD (ROUTINE X 2)

## 2012-12-26 NOTE — Progress Notes (Signed)
Patient ID: [name], female   DOB: [birth year], 79 y.o.   MRN: [id number]        HISTORY & PHYSICAL  DATE: 12/04/2012   FACILITY: [facility]  LEVEL OF CARE: SNF (31)  ALLERGIES:  Allergies  Allergen Reactions  . Penicillins Anaphylaxis  . Codeine Swelling    CHIEF COMPLAINT:  Manage acute renal failure, right lower extremity cellulitis, and DVT.    HISTORY OF PRESENT ILLNESS:  The patient is an 79 year-old, Caucasian female.    ACUTE RENAL FAILURE:  Patient has a history of chronic kidney disease.  At admission, creatinine was 4.09.  Baseline creatinine runs around 1.5.  Etiology was thought to be multifactorial including multiple antibiotics,  ACE inhibitor and Lasix.   All nephrotoxic agents were held and gentle hydration given.    Renal was consulted and renal ultrasound showed absence of right kidney and no hydronephrosis of the left.   After management, creatinine improved to 2.47.  Patient denies confusion.  She does have lower extremity swelling.    RIGHT LOWER EXTREMITY CELLULITIS:  Patient was having erythema in her right lower extremity.  Patient was initially treated with IV vancomycin and then discharged on doxycycline and Levaquin.  Blood cultures have been negative so far.    PULMONARY EMBOLISM: The pulmonary embolism remains stable. Patient is currently on anticoagulation.  Patient denies chest pain or shortness of breath. No complications reported from the anti-coagulation currently being used.   DVT: The DVTs remains stable.  Patient denies calf pain, chest pain or shortness of breath.  Patient is currently on anti-coagulation and does not report any complications from that.    PAST MEDICAL HISTORY :  Past Medical History  Diagnosis Date  . Hypertension   . Swelling of left lower extremity chronic  . Swelling of right lower extremity chronic  . Urinary incontinence   . PE (pulmonary embolism) 2006  . DVT (deep venous thrombosis) 2006  .  Hypothyroidism   . Renal disorder   . Spinal stenosis     L4, L5    PAST SURGICAL HISTORY: Past Surgical History  Procedure Laterality Date  . Abdominal hysterectomy    . Appendectomy    . Knee surgery      SOCIAL HISTORY:  reports that she has never smoked. She does not have any smokeless tobacco history on file. She reports that she does not drink alcohol or use illicit drugs.  FAMILY HISTORY:  Family History  Problem Relation Age of Onset  . Stroke Other     CURRENT MEDICATIONS: Reviewed per [facility]  REVIEW OF SYSTEMS:  See HPI otherwise 14 point ROS is negative.  PHYSICAL EXAMINATION  VS:  T 97.4       P 78      RR 18      BP 104/65      POX 96% room air        WT (Lb)  GENERAL: no acute distress, normal body habitus EYES: conjunctivae normal, sclerae normal, normal eye lids MOUTH/THROAT: lips without lesions,no lesions in the mouth,tongue is without lesions,uvula elevates in midline NECK: supple, trachea midline, no neck masses, no thyroid tenderness, no thyromegaly LYMPHATICS: no LAN in the neck, no supraclavicular LAN RESPIRATORY: breathing is even & unlabored, BS CTAB CARDIAC: RRR, no murmur,no extra heart sounds EDEMA/VARICOSITIES:  +4 bilateral lower extremity edema  ARTERIAL:  pedal pulses nonpalpable   GI:  ABDOMEN: abdomen soft, normal BS, no masses, no  tenderness  LIVER/SPLEEN: no hepatomegaly, no splenomegaly MUSCULOSKELETAL: HEAD: normal to inspection & palpation BACK: no kyphosis, scoliosis or spinal processes tenderness EXTREMITIES: LEFT UPPER EXTREMITY: strength intact, range of motion moderate  RIGHT UPPER EXTREMITY: strength intact, range of motion moderate  LEFT LOWER EXTREMITY: strength decreased, range of motion unable to perform  RIGHT LOWER EXTREMITY: strength decreased, range of motion unable to perform  PSYCHIATRIC: the patient is alert & oriented to person, affect & behavior appropriate  LABS/RADIOLOGY: CT of the head:  No acute findings.    Phosphorus 4.2, glucose 116, CO2 18, BUN 55, creatinine 2.47, otherwise BMP normal.    Albumin 2.8, total protein 5.5, otherwise liver profile normal.    Ammonia level 16.    Hemoglobin 8.7, MCV 96.9, otherwise CBC normal.    ASSESSMENT/PLAN:  Acute renal failure.  Reassess renal functions.   Right lower extremity cellulitis.  Continue antibiotics.    DVT and PE.  Continue anticoagulation.    Renovascular hypertension.  Stable.   Anemia of chronic kidney disease.  Reassess hemoglobin level.   Check CBC and BMP.   I have reviewed patient's medical records received at admission/from hospitalization.  CPT CODE: 40981

## 2012-12-27 DIAGNOSIS — I80299 Phlebitis and thrombophlebitis of other deep vessels of unspecified lower extremity: Secondary | ICD-10-CM | POA: Insufficient documentation

## 2012-12-27 DIAGNOSIS — I150 Renovascular hypertension: Secondary | ICD-10-CM | POA: Insufficient documentation

## 2012-12-27 DIAGNOSIS — L02419 Cutaneous abscess of limb, unspecified: Secondary | ICD-10-CM | POA: Insufficient documentation

## 2013-01-02 ENCOUNTER — Encounter: Payer: Self-pay | Admitting: Adult Health

## 2013-01-04 ENCOUNTER — Non-Acute Institutional Stay (SKILLED_NURSING_FACILITY): Payer: Medicare Other | Admitting: Internal Medicine

## 2013-01-04 DIAGNOSIS — N189 Chronic kidney disease, unspecified: Secondary | ICD-10-CM

## 2013-01-04 DIAGNOSIS — E7800 Pure hypercholesterolemia, unspecified: Secondary | ICD-10-CM

## 2013-01-04 DIAGNOSIS — E876 Hypokalemia: Secondary | ICD-10-CM

## 2013-01-04 DIAGNOSIS — I150 Renovascular hypertension: Secondary | ICD-10-CM

## 2013-01-08 ENCOUNTER — Encounter: Payer: Self-pay | Admitting: Adult Health

## 2013-01-10 DIAGNOSIS — E876 Hypokalemia: Secondary | ICD-10-CM | POA: Insufficient documentation

## 2013-01-10 DIAGNOSIS — E7800 Pure hypercholesterolemia, unspecified: Secondary | ICD-10-CM | POA: Insufficient documentation

## 2013-01-10 DIAGNOSIS — N189 Chronic kidney disease, unspecified: Secondary | ICD-10-CM | POA: Insufficient documentation

## 2013-01-10 NOTE — Progress Notes (Signed)
PROGRESS NOTE  DATE: 01/04/2013  FACILITY: Nursing Home Location: [facility]  LEVEL OF CARE: SNF (31)  Routine Visit  CHIEF COMPLAINT:  Manage hyperlipidemia, hypertension and chronic kidney disease  HISTORY OF PRESENT ILLNESS:  REASSESSMENT OF ONGOING PROBLEM(S):  HTN: Pt 's HTN remains stable.  Denies CP, sob, DOE, headaches, dizziness or visual disturbances.  No complications from the medications currently being used.  Last BP : 110/54.  HYPERLIPIDEMIA: No complications from the medications presently being used. Last fasting lipid panel not available.  CHRONIC KIDNEY DISEASE: The patient's chronic kidney disease remains stable.  Patient denies increasing lower extremity swelling or confusion. Last BUN and creatinine are: 57/1.93 in 6/14.  PAST MEDICAL HISTORY : Reviewed.  No changes.  CURRENT MEDICATIONS: Reviewed per [facility]  REVIEW OF SYSTEMS:  GENERAL: no change in appetite, no fatigue, no weight changes, no fever, chills or weakness RESPIRATORY: no cough, SOB, DOE, wheezing, hemoptysis CARDIAC: no chest pain, or palpitations, complains of chronic lower extremity swelling. GI: no abdominal pain, diarrhea, constipation, heart burn, nausea or vomiting  PHYSICAL EXAMINATION  VS:  T 98       P 96      RR 16      BP     POX % 97     WT (Lb)  GENERAL: no acute distress, normal body habitus EYES: conjunctivae normal, sclerae normal, normal eye lids NECK: supple, trachea midline, no neck masses, no thyroid tenderness, no thyromegaly LYMPHATICS: no LAN in the neck, no supraclavicular LAN RESPIRATORY: breathing is even & unlabored, BS CTAB CARDIAC: RRR, no murmur,no extra heart sounds, +4 bilateral lower extremity edema GI: abdomen soft, normal BS, no masses, no tenderness, no hepatomegaly, no splenomegaly PSYCHIATRIC: the patient is alert & oriented to person, affect & behavior appropriate  LABS/RADIOLOGY:  6/14 BUN 57, creatinine 1.93 otherwise BMP  normal, hemoglobin 9.1, MCV 95.2 otherwise CBC normal  ASSESSMENT/PLAN:  Renovascular hypertension-well controlled. Hyperlipidemia -check fasting lipid panel. Chronic kidney disease-stable. Hypokalemia-well-controlled. Depression-stable. Check liver profile.  CPT CODE: 16109

## 2013-01-11 ENCOUNTER — Encounter: Payer: Self-pay | Admitting: Adult Health

## 2013-01-14 ENCOUNTER — Encounter: Payer: Self-pay | Admitting: Adult Health

## 2013-01-17 ENCOUNTER — Telehealth: Payer: Self-pay | Admitting: Geriatric Medicine

## 2013-01-17 NOTE — Telephone Encounter (Signed)
Tiana Loft, called and said that you saw Naysa Puskas out at [facility]. Mrs. Christell Constant says she knows you from [employer] and your daughter Sharlynn Oliphant, was in her girl scout troop. She wants to know if you would take her mother, Mrs. [name] on as a new patient in our office. Please let me know and I will call her to schedule a new patient appointment. Her cell number [phone] if you would like to give her a call. I told her that I would send you a message and wait to hear back from you. Thanks.

## 2013-01-18 ENCOUNTER — Inpatient Hospital Stay (HOSPITAL_COMMUNITY)
Admission: EM | Admit: 2013-01-18 | Discharge: 2013-01-29 | DRG: 871 | Disposition: A | Discharge status: Hospice | Payer: Medicare Other | Attending: Internal Medicine | Admitting: Internal Medicine

## 2013-01-18 ENCOUNTER — Emergency Department (HOSPITAL_COMMUNITY): Payer: Medicare Other

## 2013-01-18 ENCOUNTER — Encounter (HOSPITAL_COMMUNITY): Payer: Self-pay | Admitting: Emergency Medicine

## 2013-01-18 DIAGNOSIS — K761 Chronic passive congestion of liver: Secondary | ICD-10-CM | POA: Diagnosis present

## 2013-01-18 DIAGNOSIS — A419 Sepsis, unspecified organism: Secondary | ICD-10-CM | POA: Diagnosis present

## 2013-01-18 DIAGNOSIS — E039 Hypothyroidism, unspecified: Secondary | ICD-10-CM | POA: Diagnosis present

## 2013-01-18 DIAGNOSIS — L02419 Cutaneous abscess of limb, unspecified: Secondary | ICD-10-CM

## 2013-01-18 DIAGNOSIS — Q600 Renal agenesis, unilateral: Secondary | ICD-10-CM

## 2013-01-18 DIAGNOSIS — N390 Urinary tract infection, site not specified: Secondary | ICD-10-CM

## 2013-01-18 DIAGNOSIS — I498 Other specified cardiac arrhythmias: Secondary | ICD-10-CM | POA: Diagnosis present

## 2013-01-18 DIAGNOSIS — E870 Hyperosmolality and hypernatremia: Secondary | ICD-10-CM | POA: Diagnosis not present

## 2013-01-18 DIAGNOSIS — R6520 Severe sepsis without septic shock: Secondary | ICD-10-CM | POA: Diagnosis present

## 2013-01-18 DIAGNOSIS — Z823 Family history of stroke: Secondary | ICD-10-CM

## 2013-01-18 DIAGNOSIS — D509 Iron deficiency anemia, unspecified: Secondary | ICD-10-CM | POA: Diagnosis present

## 2013-01-18 DIAGNOSIS — Z86718 Personal history of other venous thrombosis and embolism: Secondary | ICD-10-CM

## 2013-01-18 DIAGNOSIS — Q602 Renal agenesis, unspecified: Secondary | ICD-10-CM

## 2013-01-18 DIAGNOSIS — I129 Hypertensive chronic kidney disease with stage 1 through stage 4 chronic kidney disease, or unspecified chronic kidney disease: Secondary | ICD-10-CM | POA: Diagnosis present

## 2013-01-18 DIAGNOSIS — F0390 Unspecified dementia without behavioral disturbance: Secondary | ICD-10-CM | POA: Diagnosis present

## 2013-01-18 DIAGNOSIS — E8809 Other disorders of plasma-protein metabolism, not elsewhere classified: Secondary | ICD-10-CM | POA: Diagnosis present

## 2013-01-18 DIAGNOSIS — E875 Hyperkalemia: Secondary | ICD-10-CM | POA: Diagnosis present

## 2013-01-18 DIAGNOSIS — Z86711 Personal history of pulmonary embolism: Secondary | ICD-10-CM | POA: Diagnosis present

## 2013-01-18 DIAGNOSIS — E8770 Fluid overload, unspecified: Secondary | ICD-10-CM | POA: Clinically undetermined

## 2013-01-18 DIAGNOSIS — Q605 Renal hypoplasia, unspecified: Secondary | ICD-10-CM

## 2013-01-18 DIAGNOSIS — Z515 Encounter for palliative care: Secondary | ICD-10-CM

## 2013-01-18 DIAGNOSIS — R001 Bradycardia, unspecified: Secondary | ICD-10-CM

## 2013-01-18 DIAGNOSIS — T68XXXA Hypothermia, initial encounter: Secondary | ICD-10-CM

## 2013-01-18 DIAGNOSIS — N189 Chronic kidney disease, unspecified: Secondary | ICD-10-CM

## 2013-01-18 DIAGNOSIS — R4182 Altered mental status, unspecified: Secondary | ICD-10-CM

## 2013-01-18 DIAGNOSIS — L03119 Cellulitis of unspecified part of limb: Secondary | ICD-10-CM

## 2013-01-18 DIAGNOSIS — A4152 Sepsis due to Pseudomonas: Principal | ICD-10-CM | POA: Diagnosis present

## 2013-01-18 DIAGNOSIS — I959 Hypotension, unspecified: Secondary | ICD-10-CM | POA: Diagnosis present

## 2013-01-18 DIAGNOSIS — F411 Generalized anxiety disorder: Secondary | ICD-10-CM

## 2013-01-18 DIAGNOSIS — I4891 Unspecified atrial fibrillation: Secondary | ICD-10-CM | POA: Diagnosis present

## 2013-01-18 DIAGNOSIS — I10 Essential (primary) hypertension: Secondary | ICD-10-CM

## 2013-01-18 DIAGNOSIS — I872 Venous insufficiency (chronic) (peripheral): Secondary | ICD-10-CM | POA: Diagnosis present

## 2013-01-18 DIAGNOSIS — E46 Unspecified protein-calorie malnutrition: Secondary | ICD-10-CM | POA: Diagnosis present

## 2013-01-18 DIAGNOSIS — Z6841 Body Mass Index (BMI) 40.0 and over, adult: Secondary | ICD-10-CM

## 2013-01-18 DIAGNOSIS — N179 Acute kidney failure, unspecified: Secondary | ICD-10-CM | POA: Diagnosis present

## 2013-01-18 DIAGNOSIS — E669 Obesity, unspecified: Secondary | ICD-10-CM | POA: Diagnosis present

## 2013-01-18 DIAGNOSIS — G9340 Encephalopathy, unspecified: Secondary | ICD-10-CM

## 2013-01-18 DIAGNOSIS — F329 Major depressive disorder, single episode, unspecified: Secondary | ICD-10-CM | POA: Diagnosis present

## 2013-01-18 DIAGNOSIS — G9341 Metabolic encephalopathy: Secondary | ICD-10-CM | POA: Diagnosis present

## 2013-01-18 DIAGNOSIS — R680 Hypothermia, not associated with low environmental temperature: Secondary | ICD-10-CM | POA: Diagnosis present

## 2013-01-18 DIAGNOSIS — L97809 Non-pressure chronic ulcer of other part of unspecified lower leg with unspecified severity: Secondary | ICD-10-CM | POA: Diagnosis present

## 2013-01-18 DIAGNOSIS — E7800 Pure hypercholesterolemia, unspecified: Secondary | ICD-10-CM | POA: Diagnosis present

## 2013-01-18 DIAGNOSIS — D649 Anemia, unspecified: Secondary | ICD-10-CM

## 2013-01-18 DIAGNOSIS — Z66 Do not resuscitate: Secondary | ICD-10-CM | POA: Diagnosis present

## 2013-01-18 DIAGNOSIS — R791 Abnormal coagulation profile: Secondary | ICD-10-CM | POA: Diagnosis present

## 2013-01-18 DIAGNOSIS — B009 Herpesviral infection, unspecified: Secondary | ICD-10-CM | POA: Diagnosis present

## 2013-01-18 DIAGNOSIS — N184 Chronic kidney disease, stage 4 (severe): Secondary | ICD-10-CM | POA: Diagnosis present

## 2013-01-18 DIAGNOSIS — Z9089 Acquired absence of other organs: Secondary | ICD-10-CM

## 2013-01-18 DIAGNOSIS — R609 Edema, unspecified: Secondary | ICD-10-CM | POA: Diagnosis present

## 2013-01-18 DIAGNOSIS — M79609 Pain in unspecified limb: Secondary | ICD-10-CM

## 2013-01-18 DIAGNOSIS — IMO0002 Reserved for concepts with insufficient information to code with codable children: Secondary | ICD-10-CM

## 2013-01-18 DIAGNOSIS — L0390 Cellulitis, unspecified: Secondary | ICD-10-CM

## 2013-01-18 DIAGNOSIS — E860 Dehydration: Secondary | ICD-10-CM

## 2013-01-18 DIAGNOSIS — T45515A Adverse effect of anticoagulants, initial encounter: Secondary | ICD-10-CM | POA: Diagnosis not present

## 2013-01-18 DIAGNOSIS — Z79899 Other long term (current) drug therapy: Secondary | ICD-10-CM

## 2013-01-18 DIAGNOSIS — Z880 Allergy status to penicillin: Secondary | ICD-10-CM

## 2013-01-18 DIAGNOSIS — F3289 Other specified depressive episodes: Secondary | ICD-10-CM | POA: Diagnosis present

## 2013-01-18 HISTORY — DX: Reserved for concepts with insufficient information to code with codable children: IMO0002

## 2013-01-18 HISTORY — DX: Renal agenesis, unilateral: Q60.0

## 2013-01-18 LAB — URINE MICROSCOPIC-ADD ON

## 2013-01-18 LAB — COMPREHENSIVE METABOLIC PANEL
ALT: 33 U/L (ref 0–35)
Albumin: 2.7 g/dL — ABNORMAL LOW (ref 3.5–5.2)
Alkaline Phosphatase: 76 U/L (ref 39–117)
Calcium: 9.3 mg/dL (ref 8.4–10.5)
Potassium: 5 mEq/L (ref 3.5–5.1)
Sodium: 141 mEq/L (ref 135–145)
Total Protein: 6 g/dL (ref 6.0–8.3)

## 2013-01-18 LAB — POCT I-STAT TROPONIN I: Troponin i, poc: 0 ng/mL (ref 0.00–0.08)

## 2013-01-18 LAB — CBC WITH DIFFERENTIAL/PLATELET
Basophils Absolute: 0 10*3/uL (ref 0.0–0.1)
Basophils Relative: 0 % (ref 0–1)
Eosinophils Absolute: 0 10*3/uL (ref 0.0–0.7)
Eosinophils Relative: 0 % (ref 0–5)
HCT: 27.4 % — ABNORMAL LOW (ref 36.0–46.0)
Hemoglobin: 9.2 g/dL — ABNORMAL LOW (ref 12.0–15.0)
Lymphocytes Relative: 3 % — ABNORMAL LOW (ref 12–46)
Lymphs Abs: 0.5 10*3/uL — ABNORMAL LOW (ref 0.7–4.0)
MCH: 31.8 pg (ref 26.0–34.0)
MCHC: 33.6 g/dL (ref 30.0–36.0)
MCV: 94.8 fL (ref 78.0–100.0)
Monocytes Absolute: 0.6 K/uL (ref 0.1–1.0)
Monocytes Relative: 5 % (ref 3–12)
Neutro Abs: 12.7 K/uL — ABNORMAL HIGH (ref 1.7–7.7)
Neutrophils Relative %: 92 % — ABNORMAL HIGH (ref 43–77)
Platelets: 173 10*3/uL (ref 150–400)
RBC: 2.89 MIL/uL — ABNORMAL LOW (ref 3.87–5.11)
RDW: 14.5 % (ref 11.5–15.5)
WBC: 13.9 K/uL — ABNORMAL HIGH (ref 4.0–10.5)

## 2013-01-18 LAB — URINALYSIS, ROUTINE W REFLEX MICROSCOPIC
Bilirubin Urine: NEGATIVE
Glucose, UA: NEGATIVE mg/dL
Ketones, ur: NEGATIVE mg/dL
Nitrite: NEGATIVE
Protein, ur: NEGATIVE mg/dL
Specific Gravity, Urine: 1.02 (ref 1.005–1.030)
Urobilinogen, UA: 0.2 mg/dL (ref 0.0–1.0)
pH: 5 (ref 5.0–8.0)

## 2013-01-18 LAB — COMPREHENSIVE METABOLIC PANEL WITH GFR
AST: 29 U/L (ref 0–37)
BUN: 68 mg/dL — ABNORMAL HIGH (ref 6–23)
CO2: 21 meq/L (ref 19–32)
Chloride: 109 meq/L (ref 96–112)
Creatinine, Ser: 1.99 mg/dL — ABNORMAL HIGH (ref 0.50–1.10)
GFR calc Af Amer: 25 mL/min — ABNORMAL LOW (ref >90)
GFR calc non Af Amer: 21 mL/min — ABNORMAL LOW (ref >90)
Glucose, Bld: 93 mg/dL (ref 70–99)
Total Bilirubin: 0.2 mg/dL — ABNORMAL LOW (ref 0.3–1.2)

## 2013-01-18 LAB — LACTIC ACID, PLASMA: Lactic Acid, Venous: 1 mmol/L (ref 0.5–2.2)

## 2013-01-18 LAB — PROCALCITONIN: Procalcitonin: 0.11 ng/mL

## 2013-01-18 LAB — PHOSPHORUS: Phosphorus: 4.5 mg/dL (ref 2.3–4.6)

## 2013-01-18 LAB — APTT: aPTT: 73 seconds — ABNORMAL HIGH (ref 24–37)

## 2013-01-18 LAB — GLUCOSE, CAPILLARY: Glucose-Capillary: 99 mg/dL (ref 70–99)

## 2013-01-18 MED ORDER — DEXTROSE 5 % IV SOLN
2.0000 g | Freq: Once | INTRAVENOUS | Status: AC
  Administered 2013-01-18: 2 g via INTRAVENOUS
  Filled 2013-01-18: qty 2

## 2013-01-18 MED ORDER — VANCOMYCIN HCL 10 G IV SOLR
1500.0000 mg | INTRAVENOUS | Status: DC
  Administered 2013-01-20: 1500 mg via INTRAVENOUS
  Filled 2013-01-18: qty 1500

## 2013-01-18 MED ORDER — METRONIDAZOLE IN NACL 5-0.79 MG/ML-% IV SOLN
500.0000 mg | Freq: Three times a day (TID) | INTRAVENOUS | Status: DC
  Filled 2013-01-18 (×2): qty 100

## 2013-01-18 MED ORDER — SODIUM CHLORIDE 0.9 % IJ SOLN
INTRAMUSCULAR | Status: AC
  Administered 2013-01-18: 3 mL
  Filled 2013-01-18: qty 10

## 2013-01-18 MED ORDER — CIPROFLOXACIN IN D5W 400 MG/200ML IV SOLN
400.0000 mg | INTRAVENOUS | Status: DC
  Administered 2013-01-19 – 2013-01-20 (×2): 400 mg via INTRAVENOUS
  Filled 2013-01-18 (×4): qty 200

## 2013-01-18 MED ORDER — ONDANSETRON HCL 4 MG PO TABS: 4.0000 mg | ORAL_TABLET | Freq: Four times a day (QID) | ORAL | Status: DC | PRN

## 2013-01-18 MED ORDER — SODIUM CHLORIDE 0.9 % IV SOLN
INTRAVENOUS | Status: DC
Start: 2013-01-18 — End: 2013-01-20
  Administered 2013-01-19 – 2013-01-20 (×2): via INTRAVENOUS

## 2013-01-18 MED ORDER — ONDANSETRON HCL 4 MG/2ML IJ SOLN: 4.0000 mg | Freq: Four times a day (QID) | INTRAMUSCULAR | Status: DC | PRN

## 2013-01-18 MED ORDER — SODIUM CHLORIDE 0.9 % IV SOLN
1000.0000 mL | INTRAVENOUS | Status: DC
  Administered 2013-01-18: 1000 mL via INTRAVENOUS

## 2013-01-18 MED ORDER — DEXTROSE 5 % IV SOLN
1.0000 g | Freq: Three times a day (TID) | INTRAVENOUS | Status: DC
  Filled 2013-01-18 (×2): qty 1

## 2013-01-18 MED ORDER — PANTOPRAZOLE SODIUM 40 MG IV SOLR
40.0000 mg | INTRAVENOUS | Status: DC
  Administered 2013-01-18 – 2013-01-28 (×11): 40 mg via INTRAVENOUS
  Filled 2013-01-18 (×13): qty 40

## 2013-01-18 MED ORDER — POLYETHYLENE GLYCOL 3350 17 G PO PACK
17.0000 g | PACK | Freq: Every day | ORAL | Status: DC | PRN
  Filled 2013-01-18: qty 1

## 2013-01-18 MED ORDER — VANCOMYCIN HCL IN DEXTROSE 1-5 GM/200ML-% IV SOLN
1000.0000 mg | Freq: Once | INTRAVENOUS | Status: DC
  Filled 2013-01-18: qty 200

## 2013-01-18 MED ORDER — ACETAMINOPHEN 325 MG PO TABS: 650.0000 mg | ORAL_TABLET | Freq: Four times a day (QID) | ORAL | Status: DC | PRN

## 2013-01-18 MED ORDER — FLEET ENEMA 7-19 GM/118ML RE ENEM: 1.0000 | ENEMA | Freq: Once | RECTAL | Status: AC | PRN

## 2013-01-18 MED ORDER — SORBITOL 70 % SOLN
30.0000 mL | Freq: Every day | Status: DC | PRN
  Filled 2013-01-18: qty 30

## 2013-01-18 MED ORDER — METRONIDAZOLE IN NACL 5-0.79 MG/ML-% IV SOLN
500.0000 mg | Freq: Once | INTRAVENOUS | Status: AC
  Administered 2013-01-18: 500 mg via INTRAVENOUS
  Filled 2013-01-18 (×2): qty 100

## 2013-01-18 MED ORDER — CIPROFLOXACIN IN D5W 400 MG/200ML IV SOLN
400.0000 mg | Freq: Once | INTRAVENOUS | Status: DC
  Filled 2013-01-18: qty 200

## 2013-01-18 MED ORDER — SODIUM CHLORIDE 0.9 % IV SOLN
1000.0000 mL | Freq: Once | INTRAVENOUS | Status: AC
  Administered 2013-01-18: 1000 mL via INTRAVENOUS

## 2013-01-18 MED ORDER — ACETAMINOPHEN 650 MG RE SUPP
650.0000 mg | Freq: Four times a day (QID) | RECTAL | Status: DC | PRN
  Administered 2013-01-21: 650 mg via RECTAL
  Filled 2013-01-18: qty 1

## 2013-01-18 MED ORDER — SODIUM CHLORIDE 0.9 % IJ SOLN
3.0000 mL | Freq: Two times a day (BID) | INTRAMUSCULAR | Status: DC
  Administered 2013-01-18 – 2013-01-25 (×9): 3 mL via INTRAVENOUS

## 2013-01-18 MED ORDER — VANCOMYCIN HCL 10 G IV SOLR
2000.0000 mg | INTRAVENOUS | Status: AC
  Administered 2013-01-18: 2000 mg via INTRAVENOUS
  Filled 2013-01-18: qty 2000

## 2013-01-18 NOTE — H&P (Signed)
[facility] History and Physical  [name] ZOX:[id number] DOB: 1923-12-09 DOA: 01/18/2013  Referring physician: Dr. Oletta Lamas PCP: Gretel Acre, MD  Specialists:   Chief Complaint: Altered mental status  HPI: [name] is a 79 y.o. female is a resident at the skilled nursing facility at [facility] with a history of hypertension, chronic lower extremity cellulitis with Unna boots, history of PE/DVT on chronic anticoagulation, solitary kidney, chronic kidney disease who presents to the ED with progressive worsening level of consciousness and unresponsiveness. Patient minimally responsive to noxious stimuli and a such history was obtained from the patient's daughter and ED physician. Per daughter patient was discharged from the hospital in the middle of May and sent to [facility] facility [facility]. Over the past 2 weeks prior to admission patient has been declining remarkably in terms of her cognition. One week prior to admission patient's daughter stated that patient was not lucid and had worsening mental status. Patient's daughter stated that she was called by the nursing home facility due to the mother being unresponsive with bradycardia and hypotension. EMS was called and patient was subsequently brought to the ED. In the ED patient was noted to be unresponsive, hypothermic, bradycardic, hypotensive and urinalysis consistent with a UTI. Basic metabolic profile obtained did show an elevated BUN and creatinine otherwise was within normal limits. Point-of-care troponin was negative. Lactic acid level is 1.0. Pro calcitonin level is 0.11. CBC obtained had a white count of 13.9, hemoglobin of 9.2 otherwise was within normal limits. Chest x-ray obtained showed low as 3 volumes with left greater than right basilar opacities. Patient was given IV vancomycin, IV aztreonam, IV Flagyl, IV ciprofloxacin. Will were called to admit the patient for further evaluation and  management.  Review of Systems: The patient denies anorexia, fever, weight loss,, vision loss, decreased hearing, hoarseness, chest pain, syncope, dyspnea on exertion, peripheral edema, balance deficits, hemoptysis, abdominal pain, melena, hematochezia, severe indigestion/heartburn, hematuria, incontinence, genital sores, muscle weakness, suspicious skin lesions, transient blindness, difficulty walking, depression, unusual weight change, abnormal bleeding, enlarged lymph nodes, angioedema, and breast masses.   Past Medical History  Diagnosis Date  . Hypertension   . Swelling of left lower extremity chronic  . Swelling of right lower extremity chronic  . Urinary incontinence   . PE (pulmonary embolism) 2006  . DVT (deep venous thrombosis) 2006  . Hypothyroidism   . Renal disorder   . Spinal stenosis     L4, L5  . Solitary kidney 01/18/2013   Past Surgical History  Procedure Laterality Date  . Abdominal hysterectomy    . Appendectomy    . Knee surgery     Social History:  reports that she has never smoked. She does not have any smokeless tobacco history on file. She reports that she does not drink alcohol or use illicit drugs.  Allergies  Allergen Reactions  . Penicillins Anaphylaxis  . Codeine Swelling    Family History  Problem Relation Age of Onset  . Stroke Other     Prior to Admission medications   Medication Sig Start Date End Date Taking? Authorizing Provider  acetaminophen (TYLENOL) 500 MG tablet Take 1,000 mg by mouth every 8 (eight) hours as needed for pain. Do not exceed 4 gms of tylenol in 24 hours.   Yes Historical Provider, MD  calcium carbonate (TUMS) 500 MG chewable tablet Chew 2 tablets by mouth every 8 (eight) hours as needed (indigestion).   Yes Historical Provider, MD  furosemide (  LASIX) 20 MG tablet Take 2 tablets (40 mg total) by mouth daily. 11/29/12  Yes Marianne L York, PA-C  LORazepam (ATIVAN) 0.5 MG tablet Take 0.5 tablets (0.25 mg total) by mouth 2  (two) times daily as needed for anxiety. 11/29/12  Yes Tora Kindred York, PA-C  metoprolol succinate (TOPROL-XL) 25 MG 24 hr tablet Take 25 mg by mouth 2 (two) times daily.   Yes Historical Provider, MD  Multiple Vitamins-Minerals (DECUBI-VITE) CAPS Take 1 capsule by mouth daily. For wound healing.   Yes Historical Provider, MD  sertraline (ZOLOFT) 50 MG tablet Take 75 mg by mouth at bedtime and may repeat dose one time if needed. For depression.   Yes Historical Provider, MD  warfarin (COUMADIN) 1 MG tablet Take 0.5-1.5 mg by mouth See admin instructions. Depending on INR level.   Yes Historical Provider, MD   Physical Exam: Filed Vitals:   01/18/13 1430 01/18/13 1500 01/18/13 1530 01/18/13 1535  BP: 107/65 87/65 104/56 104/56  Pulse: 48 49 50 56  Temp: 90.9 F (32.7 C) 91.2 F (32.9 C) 91.8 F (33.2 C) 91.8 F (33.2 C)  TempSrc:    Other (Comment)  Resp: 20 19 19 21   SpO2: 100% 99% 99% 100%     General:  Obtunded minimally responsive to noxious stimuli.  Eyes: Pupils reactive to light and accommodation.  ENT: Oropharynx clear, cold sores noted on the lips, extremely dry mucous membranes.  Neck: Supple no lymphadenopathy.  Cardiovascular: Bradycardic, no murmurs rubs or gallops  Respiratory: Clear to auscultation bilaterally on anterior lung fields.  Abdomen: Soft, nontender, nondistended, positive bowel sounds.  Skin: No rashes or lesions.  Musculoskeletal: Unable to assess secondary to patient's mental status  Psychiatric: Unable to assess secondary to patient's mental status  Neurologic:  Unable to assess secondary to patient's mental status.  Extremities: Bilateral lower extremities in UNA Boots.  Labs on Admission:  Basic Metabolic Panel:  Recent Labs Lab 01/18/13 1141  NA 141  K 5.0  CL 109  CO2 21  GLUCOSE 93  BUN 68*  CREATININE 1.99*  CALCIUM 9.3   Liver Function Tests:  Recent Labs Lab 01/18/13 1141  AST 29  ALT 33  ALKPHOS 76  BILITOT 0.2*   PROT 6.0  ALBUMIN 2.7*   No results found for this basename: LIPASE, AMYLASE,  in the last 168 hours No results found for this basename: AMMONIA,  in the last 168 hours CBC:  Recent Labs Lab 01/18/13 1141  WBC 13.9*  NEUTROABS 12.7*  HGB 9.2*  HCT 27.4*  MCV 94.8  PLT 173   Cardiac Enzymes: No results found for this basename: CKTOTAL, CKMB, CKMBINDEX, TROPONINI,  in the last 168 hours  BNP (last 3 results) No results found for this basename: PROBNP,  in the last 8760 hours CBG:  Recent Labs Lab 01/18/13 1202  GLUCAP 99    Radiological Exams on Admission: Ct Head Wo Contrast  01/18/2013   *RADIOLOGY REPORT*  Clinical Data: Altered mental status.  CT HEAD WITHOUT CONTRAST  Technique:  Contiguous axial images were obtained from the base of the skull through the vertex without contrast.  Comparison: 11/26/2012  Findings: There is no acute intracranial hemorrhage, infarction, or mass lesion.  Diffuse cerebral cortical atrophy.  No acute osseous abnormality.  No change since the prior exam.  IMPRESSION: No acute abnormality.  Diffuse atrophy.   Original Report Authenticated By: Francene Boyers, M.D.   Dg Chest Port 1 View  01/18/2013   *RADIOLOGY  REPORT*  Clinical Data: Altered mental status  PORTABLE CHEST - 1 VIEW  Comparison: Prior chest x-ray 11/26/2012  Findings: Lower inspiratory volumes with new left greater than right basilar opacities.  There is pulmonary vascular congestion bordering on mild interstitial edema.  The patient is slightly rotated toward the left. Given the rotation, the cardiac and mediastinal contours appears similar compared to prior. Atherosclerosis noted in the transverse aorta.  IMPRESSION:  1.  Low inspiratory volumes with left greater than right basilar opacities which may reflect atelectasis, infiltrate or early asymmetric pulmonary edema. 2.  Increased pulmonary vascular congestion bordering on mild edema.   Original Report Authenticated By: Malachy Moan, M.D.    EKG: Independently reviewed. A. fib heart rate 129  Assessment/Plan Principal Problem:   Encephalopathy acute Active Problems:   Cellulitis   Hypotension   History of pulmonary embolism   HTN (hypertension)   Anemia   Pure hypercholesterolemia   Chronic kidney disease, unspecified   Hypothermia   UTI (lower urinary tract infection)   Dehydration   Bradycardia   Solitary kidney   Sepsis   Cellulitis of leg  #1 acute encephalopathy Likely secondary to severe sepsis. CT of the head negative. Unable to get a full neurological exam secondary to patient's mental status. Urinalysis which was done consistent with a UTI. Will check blood cultures x2. Check a TSH. Panculture. Hydrated with IV fluids. Placed empirically on IV vancomycin and IV ciprofloxacin. Follow.  #2 severe sepsis Likely secondary to urinary tract infection and probable lower extremity cellulitis. Patient noted to be hypotensive, does have a leukocytosis urinalysis consistent with a UTI patient is hypothermic. Panculture. Placed empirically on IV vancomycin and IV ciprofloxacin. Follow.  #3 hypothermia Likely secondary to problem #2. Comprehensive metabolic profile obtained on admission had a glucose level of 93. Urinalysis consistent with a UTI. Panculture. Placed empirically on IV vancomycin and IV ciprofloxacin. Continue Cytogeneticist.  #4 urinary tract infection Urine cultures pending. IV ciprofloxacin.  #5 dehydration Hold diuretics. IV fluids.  #6 chronic kidney disease/solitary kidney Creatinine is 1.99. Patient's baseline creatinine seems to range from 2.14 -2.8. Follow for now.  #7 bradycardia Likely secondary to problem #3. Check a TSH. Check a free T4. EKG done and the EEG showed A. fib with a heart rate of 129. Will panculture. We'll follow for now.  #8 anemia Hemoglobin is stable. Follow H&H. Likely iron deficiency anemia.  #9 history of PE/DVT INR is therapeutic at 3.03. Will  hold Coumadin for now as patient is unresponsive. Once patient becomes more alert we'll resume Coumadin. Once INR is less than 2 may need to this patient on heparin if still not taking oral intake.  #10 hypotension Likely secondary to severe sepsis and volume depletion. Blood pressure is somewhat responded to IV fluids. Panculture. Continue gentle hydration. Continue empiric IV vancomycin and ciprofloxacin.  #11 lower extremity cellulitis Patient with Unna boots on. We'll consult the wound care nurse. Continue empiric IV antibiotics.  #12 prophylaxis PPI for GI prophylaxis. Patient's INR is therapeutic at 3.03, for DVT prophylaxis.   Code Status: DNR Family Communication: updated daughter at bedside Disposition Plan: Admit to step down  Time spent: 65 mins  [facility] Pager [phone]  If 7PM-7AM, please contact night-coverage [email or web address] Password [email or web address] 01/18/2013, 4:05 PM

## 2013-01-18 NOTE — Progress Notes (Signed)
Pt. Received to room 3301 unresponsive max. Assist. Vitals obtained, family at bedside

## 2013-01-18 NOTE — Consult Note (Signed)
PULMONARY  / CRITICAL CARE MEDICINE  Name: [name] MRN: [id number] DOB: 1924-03-07    ADMISSION DATE:  01/18/2013 CONSULTATION DATE:  Lennart Pall  REFERRING MD :  Ghim  PRIMARY SERVICE:  [facility]  CHIEF COMPLAINT:  Severe sepsis   BRIEF PATIENT DESCRIPTION:  This is a 47 YOF who resides at a SNF. Presents to ER on 7/6 w/ progressive decreased LOC. Per daughter has been at [facility] since may 2014 where she had been receiving wound care for LE cellulitis w/ UNA boots. She states that her mother had been declining remarkably for the last 2 weeks (specifically from a cognitive stand-point). On arrival to ER she was found to be obtunded, hypothermic, and dx eval was + for UTI. PCCM was called to eval for potential admit to ICU.   PAST MEDICAL HISTORY :  Past Medical History  Diagnosis Date  . Hypertension   . Swelling of left lower extremity chronic  . Swelling of right lower extremity chronic  . Urinary incontinence   . PE (pulmonary embolism) 2006  . DVT (deep venous thrombosis) 2006  . Hypothyroidism   . Renal disorder   . Spinal stenosis     L4, L5   Past Surgical History  Procedure Laterality Date  . Abdominal hysterectomy    . Appendectomy    . Knee surgery     Prior to Admission medications   Medication Sig Start Date End Date Taking? Authorizing Provider  acetaminophen (TYLENOL) 500 MG tablet Take 1,000 mg by mouth every 8 (eight) hours as needed for pain. Do not exceed 4 gms of tylenol in 24 hours.   Yes Historical Provider, MD  calcium carbonate (TUMS) 500 MG chewable tablet Chew 2 tablets by mouth every 8 (eight) hours as needed (indigestion).   Yes Historical Provider, MD  furosemide (LASIX) 20 MG tablet Take 2 tablets (40 mg total) by mouth daily. 11/29/12  Yes Marianne L York, PA-C  LORazepam (ATIVAN) 0.5 MG tablet Take 0.5 tablets (0.25 mg total) by mouth 2 (two) times daily as needed for anxiety. 11/29/12  Yes Tora Kindred York, PA-C  metoprolol succinate (TOPROL-XL) 25  MG 24 hr tablet Take 25 mg by mouth 2 (two) times daily.   Yes Historical Provider, MD  Multiple Vitamins-Minerals (DECUBI-VITE) CAPS Take 1 capsule by mouth daily. For wound healing.   Yes Historical Provider, MD  sertraline (ZOLOFT) 50 MG tablet Take 75 mg by mouth at bedtime and may repeat dose one time if needed. For depression.   Yes Historical Provider, MD  warfarin (COUMADIN) 1 MG tablet Take 0.5-1.5 mg by mouth See admin instructions. Depending on INR level.   Yes Historical Provider, MD   Allergies  Allergen Reactions  . Penicillins Anaphylaxis  . Codeine Swelling    FAMILY HISTORY:  Family History  Problem Relation Age of Onset  . Stroke Other    SOCIAL HISTORY:  reports that she has never smoked. She does not have any smokeless tobacco history on file. She reports that she does not drink alcohol or use illicit drugs.  REVIEW OF SYSTEMS:   unable SUBJECTIVE:  Appears terminally ill  VITAL SIGNS: Temp:  [90.1 F (32.3 C)-91.2 F (32.9 C)] 91.2 F (32.9 C) (07/04 1500) Pulse Rate:  [44-115] 49 (07/04 1500) Resp:  [15-26] 19 (07/04 1500) BP: (78-107)/(42-73) 87/65 mmHg (07/04 1500) SpO2:  [95 %-100 %] 99 % (07/04 1500)  PHYSICAL EXAMINATION: General:  Acute on chronically ill appearing elderly female.  Neuro:  Unresponsive  HEENT:  Mucous membranes are dry, neck veins flat  Cardiovascular:  Huston Foley rrr Lungs:  decreased Abdomen:  Obese + bowel sounds/ hypoactive  Musculoskeletal:  Intact  Skin:  Lower extremities on UNA boot.    Recent Labs Lab 01/18/13 1141  NA 141  K 5.0  CL 109  CO2 21  BUN 68*  CREATININE 1.99*  GLUCOSE 93    Recent Labs Lab 01/18/13 1141  HGB 9.2*  HCT 27.4*  WBC 13.9*  PLT 173   Ct Head Wo Contrast  01/18/2013   *RADIOLOGY REPORT*  Clinical Data: Altered mental status.  CT HEAD WITHOUT CONTRAST  Technique:  Contiguous axial images were obtained from the base of the skull through the vertex without contrast.  Comparison:  11/26/2012  Findings: There is no acute intracranial hemorrhage, infarction, or mass lesion.  Diffuse cerebral cortical atrophy.  No acute osseous abnormality.  No change since the prior exam.  IMPRESSION: No acute abnormality.  Diffuse atrophy.   Original Report Authenticated By: Francene Boyers, M.D.   Dg Chest Port 1 View  01/18/2013   *RADIOLOGY REPORT*  Clinical Data: Altered mental status  PORTABLE CHEST - 1 VIEW  Comparison: Prior chest x-ray 11/26/2012  Findings: Lower inspiratory volumes with new left greater than right basilar opacities.  There is pulmonary vascular congestion bordering on mild interstitial edema.  The patient is slightly rotated toward the left. Given the rotation, the cardiac and mediastinal contours appears similar compared to prior. Atherosclerosis noted in the transverse aorta.  IMPRESSION:  1.  Low inspiratory volumes with left greater than right basilar opacities which may reflect atelectasis, infiltrate or early asymmetric pulmonary edema. 2.  Increased pulmonary vascular congestion bordering on mild edema.   Original Report Authenticated By: Malachy Moan, M.D.    ASSESSMENT / PLAN: 1) Severe Sepsis due to UT source +/- LE cellulitis 2) acute encephalopathy, super imposed on what sounds like early dementia  3) hypothermia 4) Chronic LE cellulitis  5) h/o PE/DVT w/ therapeutic INR  6) CRI: current creatinine actually better than recent admits   This is an elderly female who presents w/ severe sepsis & hypothermia. Source likely urinary tract source and possibly LE cellulitis. Have had a long discussion w/ the pt's daughter. She does not want heroic interventions as these instructions had been communicated by her mother to be her desire. We specifically discussed limitations of care and agreed that we would not offer: Intubation, mechanical ventilation, Central IV access, vasoactive gtts, antiarrythmic's, or CPR. We have agreed that we would treat conservatively with  IVs and antibiotics.   Recommendations: -admit to MED/SURG -Full DNR -IVFs and empiric abx and pan culture  -check TSH -ask wound care to eval LEs  -we will s/o does not require critical care services.   Pulmonary and Critical Care Medicine [facility] Pager: [phone]  01/18/2013, 3:10 PM  CC time 1 hour  Levy Pupa, MD, PhD 01/18/2013, 3:56 PM [facility] [phone] or if no answer [phone]

## 2013-01-18 NOTE — ED Provider Notes (Signed)
Medical screening examination/treatment/procedure(s) were conducted as a shared visit with non-physician practitioner(s) and myself.  I personally evaluated the patient during the encounter  Pt with worsening altered mentation over the past few weeks, has been treated for lower extremity wounds and cellulitis.  Pt here is obtunded, found to be quite hypothermic, is on [employer], evaluated for sepsis, less likely with normal lactic acid.  IVF's, cultures, and broad abx give to initially cover for cellulitis, however UA came back positive for UTI, IV cipro added.  Will need step down due to hypothermia and requiring significant monitoring.  Mild hypotension improved with IVF's in the ED.  Pt is arousable to stimuli,. Protecting airway.  Pt had a TSH in May 2014 that was normal.  Head CT shows no acute abn's.     CRITICAL CARE Performed by: Lear Ng Total critical care time: 30 min Critical care time was exclusive of separately billable procedures and treating other patients. Critical care was necessary to treat or prevent imminent or life-threatening deterioration. Critical care was time spent personally by me on the following activities: development of treatment plan with patient and/or surrogate as well as nursing, discussions with consultants, evaluation of patient's response to treatment, examination of patient, obtaining history from patient or surrogate, ordering and performing treatments and interventions, ordering and review of laboratory studies, ordering and review of radiographic studies, pulse oximetry and re-evaluation of patient's condition.  Impression: Altered mentation Hypothermia UTI Chronic stasis ulcers, lower extremity wounds and associated cellulitis    Gavin Pound. Sharin Altidor, MD 01/18/13 1411

## 2013-01-18 NOTE — ED Notes (Signed)
MD at bedside. 

## 2013-01-18 NOTE — ED Notes (Signed)
Per pt's family pt has been deteriorating over a 2 wk period. Pt has not been talking, or doing the things she would normally be able to do. Pt is not responsive to speech but withdraws from pain.

## 2013-01-18 NOTE — ED Notes (Signed)
Per EMS, patient has had progressive altered mental status with worsening in the last 2 weeks.   Baseline per [facility] to EMS was described as talking and interactive.   Patient responds to painful stimuli at this time.  V/S by EMS 126/85, 43, 18, 98% on RA.  CBG 91.  Patient has previous CVA per EMS, but unsure of residual weaknesses.   Patient has cellulitis bilateral legs that are wrapped from nursing facility.   Bandage on R elbow.

## 2013-01-18 NOTE — Progress Notes (Addendum)
ANTIBIOTIC CONSULT NOTE - INITIAL  Pharmacy Consult for Vancomycin, Flagyl, Aztreonam Indication: rule out sepsis  Allergies  Allergen Reactions  . Penicillins Anaphylaxis  . Codeine Swelling    Patient Measurements:   Weight ~ 110 kg (May 2014) Height ~ 64 inches (May 2014)  Vital Signs: Temp: 90.2 F (32.3 C) (07/04 1148) Temp src: Rectal (07/04 1148) BP: 97/69 mmHg (07/04 1059) Pulse Rate: 49 (07/04 1059) Intake/Output from previous day:   Intake/Output from this shift:    Labs:  Recent Labs  01/18/13 1141  WBC 13.9*  HGB 9.2*  PLT 173   Last SCr 2.47 (May 2014)   Microbiology: No results found for this or any previous visit (from the past 720 hour(s)).  Medical History: Past Medical History  Diagnosis Date  . Hypertension   . Swelling of left lower extremity chronic  . Swelling of right lower extremity chronic  . Urinary incontinence   . PE (pulmonary embolism) 2006  . DVT (deep venous thrombosis) 2006  . Hypothyroidism   . Renal disorder   . Spinal stenosis     L4, L5    Medications:  Tylenol, Calcium Carb, Lasix, Lorazepam, Metoprolol XL, MVI, Zoloft, Coumadin   Pt has been ordered Aztreonam 2gm IV x 1, Metronidazole 500 mg IV x 1, and Vancomycin 1gm IV x 1 in ED.  Assessment: 79 yo F presents to ED with 2 week hx of AMS, acutely worse in the last 24 hours.  Currently unresponsive but withdraws to pain.  Pt has hx of cellulitis to lower extremities and was receiving wound care/wraps at nursing home.  To start broad spectrum antibiotics for sepsis.  Last SCr 2.47 from May 2014, with estimated CrCl ~ 16 ml/min.  Baseline labs in process.  Goal of Therapy:  Vancomycin trough level 15-20 mcg/ml Renal dose adjustment of antibiotics  Plan:  Called RN, will change Vancomycin to 2gm IV x 1 dose. Metronidazole 500mg  IV q8h. Follow-up renal function and redose when baseline labs result.  [employer], Pharm.D., BCPS Clinical  Pharmacist Pager [phone] 01/18/2013 12:45 PM   Addendum: Preliminary cultures suggest UTI.  To add Cipro.  Cipro 400mg  IV x 1 ordered by ED MD.  Anticipate admitting medical team will narrow antibiotics.  Patient currently on Vancomycin, Aztreonam, Flagyl, and Cipro.  Labs returned with SCr 1.99, estimated CrCl ~ 22 ml/min.  Plan:  1. Vancomycin 1500mg  IV q48h - next dose due 7/6 at 1600 2. Aztreonam 1gm IV q8h - next dose due 7/4 at 2200 3. Cipro 400mg  IV q24h - next dose due 7/5 at 1400 4. Flagyl 500mg  IV q8h  [employer], Pharm.D., BCPS Clinical Pharmacist Pager [phone] 01/18/2013 2:44 PM

## 2013-01-18 NOTE — ED Provider Notes (Signed)
History    CSN: [id number] Arrival date & time 01/18/13  1047  First MD Initiated Contact with Patient 01/18/13 1105     Chief Complaint  Patient presents with  . Altered Mental Status   (Consider location/radiation/quality/duration/timing/severity/associated sxs/prior Treatment) HPI Comments: Patient is an 79 year old female past medical history significant for hypertension, CKD, history of DVT and PE, hypothyroidism, depression presented to emergency department for 2 weeks of declining mental status. Per EMS patient was unresponsive upon picking the patient up in the nursing home. According to the daughter 2 weeks ago she began to notice some personality changes and her mother stating she been very confused and seemed more aggressive than normal. Daughter states that the patient began to try fighting the staff over take it her medications. The daughter states yesterday her mother seemed very confused and "out of it" but the patient was able to minimally communicate with the daughter yesterday. The daughter endorses that today her mother seems significantly worse and more unresponsive. Prior to the onset of the decrease in mental status the patient's daughter states she did not notice that her mother is sick but she had been evaluated for bilateral cellulitis in the lower extremities that has been evaluated and the patient has wound wraps on both legs. The daughter states yesterday during the dressing change the patient's legs seemed very weepy. The daughter states that the only medication change recently and was an increase in her Zoloft due to worsening depression. Patient is a level V caveat due to altered mental status.  Patient is a 79 y.o. female presenting with altered mental status. The history is provided by a relative and the EMS personnel.  Altered Mental Status      Past Medical History  Diagnosis Date  . Hypertension   . Swelling of left lower extremity chronic  . Swelling of  right lower extremity chronic  . Urinary incontinence   . PE (pulmonary embolism) 2006  . DVT (deep venous thrombosis) 2006  . Hypothyroidism   . Renal disorder   . Spinal stenosis     L4, L5  . Solitary kidney 01/18/2013   Past Surgical History  Procedure Laterality Date  . Abdominal hysterectomy    . Appendectomy    . Knee surgery     Family History  Problem Relation Age of Onset  . Stroke Other    History  Substance Use Topics  . Smoking status: Never Smoker   . Smokeless tobacco: Not on file  . Alcohol Use: No   OB History   Grav Para Term Preterm Abortions TAB SAB Ect Mult Living                 Review of Systems  Unable to perform ROS: Mental status change  Psychiatric/Behavioral: Positive for altered mental status.    Allergies  Penicillins and Codeine  Home Medications   No current outpatient prescriptions on file. BP 108/63  Pulse 71  Temp(Src) 98.5 F (36.9 C) (Axillary)  Resp 18  Ht 5\' 2"  (1.575 m)  Wt 248 lb 14.4 oz (112.9 kg)  BMI 45.51 kg/m2  SpO2 97% Physical Exam  Constitutional: She appears well-developed and well-nourished. She appears ill.  HENT:  Head: Normocephalic and atraumatic.  Eyes: Conjunctivae are normal. Pupils are equal, round, and reactive to light.  Cardiovascular: Normal rate, regular rhythm and normal heart sounds.   Cap Refill < 2  Pulmonary/Chest: Effort normal.  Abdominal: Soft.  Neurological:  Gag reflex intact. Patient  verbally responsive with sternal rub.   Skin: Skin is dry. There is erythema. There is pallor.  Skin cool to touch.  Bilateral lower extremities wrapped d/t wound care.     ED Course  Procedures (including critical care time) Labs Reviewed  CBC WITH DIFFERENTIAL - Abnormal; Notable for the following:    WBC 13.9 (*)    RBC 2.89 (*)    Hemoglobin 9.2 (*)    HCT 27.4 (*)    Neutrophils Relative % 92 (*)    Neutro Abs 12.7 (*)    Lymphocytes Relative 3 (*)    Lymphs Abs 0.5 (*)    All  other components within normal limits  COMPREHENSIVE METABOLIC PANEL - Abnormal; Notable for the following:    BUN 68 (*)    Creatinine, Ser 1.99 (*)    Albumin 2.7 (*)    Total Bilirubin 0.2 (*)    GFR calc non Af Amer 21 (*)    GFR calc Af Amer 25 (*)    All other components within normal limits  URINALYSIS, ROUTINE W REFLEX MICROSCOPIC - Abnormal; Notable for the following:    APPearance TURBID (*)    Hgb urine dipstick MODERATE (*)    Leukocytes, UA LARGE (*)    All other components within normal limits  PROTIME-INR - Abnormal; Notable for the following:    Prothrombin Time 30.3 (*)    INR 3.03 (*)    All other components within normal limits  APTT - Abnormal; Notable for the following:    aPTT 73 (*)    All other components within normal limits  URINE MICROSCOPIC-ADD ON - Abnormal; Notable for the following:    Bacteria, UA MANY (*)    All other components within normal limits  COMPREHENSIVE METABOLIC PANEL - Abnormal; Notable for the following:    Potassium 5.7 (*)    Chloride 116 (*)    CO2 16 (*)    BUN 65 (*)    Creatinine, Ser 2.00 (*)    Total Protein 5.4 (*)    Albumin 2.3 (*)    Total Bilirubin 0.2 (*)    GFR calc non Af Amer 21 (*)    GFR calc Af Amer 25 (*)    All other components within normal limits  CBC - Abnormal; Notable for the following:    WBC 12.4 (*)    RBC 2.70 (*)    Hemoglobin 8.4 (*)    HCT 25.7 (*)    All other components within normal limits  MRSA PCR SCREENING  CULTURE, BLOOD (ROUTINE X 2)  CULTURE, BLOOD (ROUTINE X 2)  URINE CULTURE  PROCALCITONIN  GLUCOSE, CAPILLARY  LACTIC ACID, PLASMA  MAGNESIUM  PHOSPHORUS  TSH  T4, FREE  TSH  T4, FREE  POCT I-STAT TROPONIN I   Ct Head Wo Contrast  01/18/2013   *RADIOLOGY REPORT*  Clinical Data: Altered mental status.  CT HEAD WITHOUT CONTRAST  Technique:  Contiguous axial images were obtained from the base of the skull through the vertex without contrast.  Comparison: 11/26/2012  Findings:  There is no acute intracranial hemorrhage, infarction, or mass lesion.  Diffuse cerebral cortical atrophy.  No acute osseous abnormality.  No change since the prior exam.  IMPRESSION: No acute abnormality.  Diffuse atrophy.   Original Report Authenticated By: Francene Boyers, M.D.   Dg Chest Port 1 View  01/18/2013   *RADIOLOGY REPORT*  Clinical Data: Altered mental status  PORTABLE CHEST - 1 VIEW  Comparison: Prior chest  x-ray 11/26/2012  Findings: Lower inspiratory volumes with new left greater than right basilar opacities.  There is pulmonary vascular congestion bordering on mild interstitial edema.  The patient is slightly rotated toward the left. Given the rotation, the cardiac and mediastinal contours appears similar compared to prior. Atherosclerosis noted in the transverse aorta.  IMPRESSION:  1.  Low inspiratory volumes with left greater than right basilar opacities which may reflect atelectasis, infiltrate or early asymmetric pulmonary edema. 2.  Increased pulmonary vascular congestion bordering on mild edema.   Original Report Authenticated By: Malachy Moan, M.D.   Critical care consulted and feel this patient is eligible for Med-Surg step down bed.   1. Altered mental status   2. Hypothermia, initial encounter   3. UTI (urinary tract infection)   4. Encephalopathy acute   5. UTI (lower urinary tract infection)   6. Sepsis   7. Bradycardia   8. Dehydration     MDM  Patient presenting altered and minimally responsive. Code sepsis called on patent. Patient hypothermic, bear hugger applied. BP responsive to IVF, no need for pressors at this time. Labs and imaging reviewed. Patient will be admitted to med-surg step down. Critical care was consulted. Patient is a DNR. Patient d/w with Dr. Oletta Lamas, agrees with plan. The patient appears reasonably stabilized for admission considering the current resources, flow, and capabilities available in the ED at this time, and I doubt any other [facility]  requiring further screening and/or treatment in the ED prior to admission.   Jeannetta Ellis, PA-C 01/19/13 1627

## 2013-01-19 DIAGNOSIS — I498 Other specified cardiac arrhythmias: Secondary | ICD-10-CM

## 2013-01-19 DIAGNOSIS — E860 Dehydration: Secondary | ICD-10-CM

## 2013-01-19 LAB — COMPREHENSIVE METABOLIC PANEL
CO2: 16 mEq/L — ABNORMAL LOW (ref 19–32)
Calcium: 8.5 mg/dL (ref 8.4–10.5)
Creatinine, Ser: 2 mg/dL — ABNORMAL HIGH (ref 0.50–1.10)
GFR calc Af Amer: 25 mL/min — ABNORMAL LOW (ref >90)
GFR calc non Af Amer: 21 mL/min — ABNORMAL LOW (ref >90)
Glucose, Bld: 90 mg/dL (ref 70–99)
Sodium: 144 mEq/L (ref 135–145)
Total Protein: 5.4 g/dL — ABNORMAL LOW (ref 6.0–8.3)

## 2013-01-19 LAB — CBC
HCT: 25.7 % — ABNORMAL LOW (ref 36.0–46.0)
Hemoglobin: 8.4 g/dL — ABNORMAL LOW (ref 12.0–15.0)
RBC: 2.7 MIL/uL — ABNORMAL LOW (ref 3.87–5.11)

## 2013-01-19 MED ORDER — BACITRACIN-NEOMYCIN-POLYMYXIN OINTMENT TUBE
TOPICAL_OINTMENT | Freq: Two times a day (BID) | CUTANEOUS | Status: DC
  Administered 2013-01-19 – 2013-01-26 (×14): via TOPICAL
  Filled 2013-01-19 (×2): qty 15

## 2013-01-19 MED ORDER — MORPHINE SULFATE 2 MG/ML IJ SOLN
2.0000 mg | INTRAMUSCULAR | Status: DC | PRN
  Administered 2013-01-19 – 2013-01-21 (×4): 2 mg via INTRAVENOUS
  Filled 2013-01-19 (×4): qty 1

## 2013-01-19 MED ORDER — DEXTROSE 5 % IV SOLN
1.0000 g | Freq: Three times a day (TID) | INTRAVENOUS | Status: DC
  Administered 2013-01-19 – 2013-01-26 (×19): 1 g via INTRAVENOUS
  Filled 2013-01-19 (×24): qty 1

## 2013-01-19 MED ORDER — BACITRACIN-NEOMYCIN-POLYMYXIN 400-5-5000 EX OINT
TOPICAL_OINTMENT | Freq: Two times a day (BID) | CUTANEOUS | Status: DC
  Filled 2013-01-19: qty 1

## 2013-01-19 MED ORDER — METOPROLOL TARTRATE 1 MG/ML IV SOLN
5.0000 mg | Freq: Four times a day (QID) | INTRAVENOUS | Status: DC
  Administered 2013-01-20 – 2013-01-29 (×28): 5 mg via INTRAVENOUS
  Filled 2013-01-19 (×42): qty 5

## 2013-01-19 NOTE — Progress Notes (Signed)
PT Cancellation Note  Patient Details Name: [name] MRN: [id number] DOB: 03/23/1924   Cancelled Treatment:    Reason Eval/Treat Not Completed: Patient's level of consciousness; per RN responsive only to painful stimuli.  Will attempt again tomorrow.   WYNN,CYNDI 01/19/2013, 12:11 PM

## 2013-01-19 NOTE — Progress Notes (Signed)
Clinical Social Work Department BRIEF PSYCHOSOCIAL ASSESSMENT 01/19/2013  Patient:  [name], [name]     Account Number:  [id number]     Admit date:  01/18/2013  Clinical Social Worker:  Illene Silver  Date/Time:  01/19/2013 04:15 PM  Referred by:  Physician  Date Referred:  01/19/2013 Referred for  SNF Placement   Other Referral:   Interview type:  Other - See comment Other interview type:   Database review.  Pt sleepng soundly.  No family at bedside.  Will ask CSW on 01/20/13 to f/u.    PSYCHOSOCIAL DATA Living Status:  FACILITY Admitted from facility:  [facility] Level of care:  Skilled Nursing Facility Primary support name:  cathy christopher Primary support relationship to patient:  CHILD, ADULT Degree of support available:   Undetermined at this time.    CURRENT CONCERNS Current Concerns  Post-Acute Placement   Other Concerns:    SOCIAL WORK ASSESSMENT / PLAN Per chart, pt is from [facility].  CSW to f/u with pt to determine d/c plan.  CSW will continue to follow and facilitate d/c plan as appropriate.   Assessment/plan status:  Psychosocial Support/Ongoing Assessment of Needs Other assessment/ plan:   Information/referral to community resources:    PATIENT'S/FAMILY'S RESPONSE TO PLAN OF CARE: Undetermined.  Unit CSW to f/u.

## 2013-01-19 NOTE — Progress Notes (Addendum)
[facility] Progress Note [place] TEAM 1 - Stepdown/ICU TEAM   [name] MVH:[id number] DOB: June 21, 1924 DOA: 01/18/2013 PCP: Gretel Acre, MD  Brief narrative: [name] is a 79 y.o. 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  Patient minimally responsive to noxious stimuli and a such history was obtained from the patient's daughter and ED physician.  Per daughter patient was discharged from the hospital in the middle of May and sent to skilled nursing facility. Over the past 2 weeks prior to admission patient has been declining remarkably in terms of her cognition. One week prior to admission patient's daughter stated that patient was not lucid and had worsening mental status. Patient's daughter stated that she was called by the nursing home facility due to the mother being unresponsive with bradycardia and hypotension. EMS was called and patient was subsequently brought to the ED. In the ED patient was noted to be unresponsive, hypothermic, bradycardic, hypotensive and urinalysis consistent with a UTI. Basic metabolic profile obtained did show an elevated BUN and creatinine otherwise was within normal limits. Point-of-care troponin was negative. Lactic acid level is 1.0. Pro calcitonin level is 0.11. CBC obtained had a white count of 13.9, hemoglobin of 9.2 otherwise was within normal limits. Chest x-ray obtained showed low as 3 volumes with left greater than right basilar opacities. Patient was given IV vancomycin, IV aztreonam, IV Flagyl, IV ciprofloxacin.    Assessment/Plan: Principal Problem:   Encephalopathy acute - likely due to UTI- will follow with treatment  Active Problems:   Hypotension/dehydration - dehydration related in addition to sepsis - cont to hydrate  Severe anasarca/hypoalbuminemia - per family the patient has been having problems with pedal edema for the past couple of months and Unna boots were placed recently. -With IV hydration that was started yesterday on admission, she is noted to have swelling in bilateral upper extremities as well -I suspect it may be related to malnutrition    Chronic kidney disease-stage IV -Stable  Hyperkalemia -Continue to hydrate-this should help potassium improve-repeat potassium in a.m.    Hypothermia -Continue heating blankets-likely secondary to sepsis -Check TSH and free T4 to ensure this is not myxedema    UTI (lower urinary tract infection) -Continue current antibiotics and followup on culture    Bradycardia/A. fib with RVR -Patient is on Toprol as an outpatient which was held yesterday due to hypotension and bradycardia -However now having atrial fibrillation with a rapid rate -Will therefore, resume metoprolol via IV as patient is to unresponsive to take oral meds (at a low dose while watching blood pressure carefully)    Cellulitis of leg -I have  unwrapped her Unna boots and it appears that currently there is no cellulitis present. There are numerous shallow ulcers likely secondary to ruptured blebs. There is a significant amount of drainage of clear liquid from her wounds. -At this time her family would like the Unna boots not to be replaced   Code Status: DO NOT RESUSCITATE  Family Communication: Daughter/son-in-law and granddaughter  Disposition Plan: Follow in step down unit  Consultants: None  Procedures: None  Antibiotics: Vancomycin and ciprofloxacin 7/4  DVT prophylaxis: On Coumadin with elevated INR  HPI/Subjective: Patient is unresponsive and intermittently moaning. Extensive conversation had with patient's  family. Into the family the patient has been confused for the past 2  weeks and has not been eating or drinking much. Though she was checked multiple times for urinary tract infection at the nursing facility, she was not found to have an infection.   Objective: Blood pressure 108/63, pulse 71, temperature 98.5 F (36.9 C), temperature source Axillary, resp. rate 18, height 5\' 2"  (1.575 m), weight 112.9 kg (248 lb 14.4 oz), SpO2 97.00%.  Intake/Output Summary (Last 24 hours) at 01/19/13 1550 Last data filed at 01/19/13 1400  Gross per 24 hour  Intake 1468.33 ml  Output    425 ml  Net 1043.33 ml     Exam: General:  poorly responsive-occasionally moaning-No acute respiratory distress Lungs: Clear to auscultation bilaterally without wheezes or crackles Cardiovascular: Iregular rate and rhythm without murmur gallop or rub normal S1 and S2 Abdomen: Nontender, nondistended, soft, bowel sounds positive, no rebound, no ascites, no appreciable mass Extremities: No significant cyanosis, clubbing-there is significant edema and  edema bilateral lower extremities and upper extremity Skin: Significant shallow ulcers on lower extremities  Data Reviewed: Basic Metabolic Panel:  Recent Labs Lab 01/18/13 1141 01/18/13 1938 01/19/13 0545  NA 141  --  144  K 5.0  --  5.7*  CL 109  --  116*  CO2 21  --  16*  GLUCOSE 93  --  90  BUN 68*  --  65*  CREATININE 1.99*  --  2.00*  CALCIUM 9.3  --  8.5  MG  --  1.7  --   PHOS  --  4.5  --    Liver Function Tests:  Recent Labs Lab 01/18/13 1141 01/19/13 0545  AST 29 23  ALT 33 26  ALKPHOS 76 71  BILITOT 0.2* 0.2*  PROT 6.0 5.4*  ALBUMIN 2.7* 2.3*   No results found for this basename: LIPASE, AMYLASE,  in the last 168 hours No results found for this basename: AMMONIA,  in the last 168 hours CBC:  Recent Labs Lab 01/18/13 1141 01/19/13 0545  WBC 13.9* 12.4*  NEUTROABS 12.7*  --   HGB 9.2* 8.4*  HCT 27.4* 25.7*  MCV 94.8 95.2  PLT 173 163   Cardiac Enzymes: No results found for this basename:  CKTOTAL, CKMB, CKMBINDEX, TROPONINI,  in the last 168 hours BNP (last 3 results) No results found for this basename: PROBNP,  in the last 8760 hours CBG:  Recent Labs Lab 01/18/13 1202  GLUCAP 99    Recent Results (from the past 240 hour(s))  MRSA PCR SCREENING     Status: None   Collection Time    01/18/13  4:24 PM      Result Value Range Status   MRSA by PCR NEGATIVE  NEGATIVE Final   Comment:            The GeneXpert MRSA Assay (FDA     approved for NASAL specimens     only), is one component of a     comprehensive MRSA colonization     surveillance program. It is not     intended to diagnose MRSA     infection nor to guide or     monitor treatment for     MRSA infections.     Studies:  Recent x-ray studies have been reviewed in detail by the Attending Physician  Scheduled Meds:  Scheduled Meds: . ciprofloxacin  400 mg Intravenous Once  . ciprofloxacin  400 mg Intravenous Q24H  . pantoprazole (  PROTONIX) IV  40 mg Intravenous Q24H  . sodium chloride  3 mL Intravenous Q12H  . [START ON 01/20/2013] vancomycin  1,500 mg Intravenous Q48H   Continuous Infusions: . sodium chloride 100 mL/hr at 01/19/13 1019    Time spent on care of this patient: 35 minutes   Day Greb,MD  [facility] Office  [phone] Pager - Text Page per Loretha Stapler as per below:  On-Call/Text Page:      [email or web address]      password [email or web address]  If 7PM-7AM, please contact night-coverage [email or web address] Password [email or web address] 01/19/2013, 3:50 PM   LOS: 1 day

## 2013-01-20 LAB — TSH
TSH: 2.122 u[IU]/mL (ref 0.350–4.500)
TSH: 2.054 u[IU]/mL (ref 0.350–4.500)

## 2013-01-20 LAB — CBC
Hemoglobin: 8.3 g/dL — ABNORMAL LOW (ref 12.0–15.0)
MCH: 31.3 pg (ref 26.0–34.0)
Platelets: 167 10*3/uL (ref 150–400)
RBC: 2.65 MIL/uL — ABNORMAL LOW (ref 3.87–5.11)
WBC: 9.3 10*3/uL (ref 4.0–10.5)

## 2013-01-20 LAB — T4, FREE
Free T4: 0.86 ng/dL (ref 0.80–1.80)
Free T4: 0.76 ng/dL — ABNORMAL LOW (ref 0.80–1.80)

## 2013-01-20 LAB — BASIC METABOLIC PANEL
Calcium: 8.8 mg/dL (ref 8.4–10.5)
GFR calc Af Amer: 23 mL/min — ABNORMAL LOW (ref >90)
GFR calc non Af Amer: 20 mL/min — ABNORMAL LOW (ref >90)
Glucose, Bld: 88 mg/dL (ref 70–99)
Potassium: 5.5 mEq/L — ABNORMAL HIGH (ref 3.5–5.1)
Sodium: 147 mEq/L — ABNORMAL HIGH (ref 135–145)

## 2013-01-20 LAB — URINE CULTURE: Colony Count: >=100000

## 2013-01-20 LAB — PROTIME-INR
INR: 4.12 — ABNORMAL HIGH (ref 0.00–1.49)
Prothrombin Time: 38.3 seconds — ABNORMAL HIGH (ref 11.6–15.2)

## 2013-01-20 MED ORDER — WHITE PETROLATUM GEL
Status: AC
  Administered 2013-01-20: 0.2
  Filled 2013-01-20: qty 5

## 2013-01-20 MED ORDER — SODIUM CHLORIDE 0.9 % IV SOLN
INTRAVENOUS | Status: DC
  Administered 2013-01-20: 22:00:00 via INTRAVENOUS

## 2013-01-20 NOTE — Evaluation (Signed)
Physical Therapy Evaluation Patient Details Name: [name] MRN: [id number] DOB: 09/19/1923 Today's Date: 01/20/2013 Time: 4098-1191 PT Time Calculation (min): 17 min  PT Assessment / Plan / Recommendation History of Present Illness  Pt admitted with decr level of consciousness, UTI, sepsis  Clinical Impression  Pt admitted with encephalopathy/sepsis. Pt currently with functional limitations due to the deficits listed below (see PT Problem List).  Pt will benefit from skilled PT to increase their independence and safety with mobility to allow discharge to the venue listed below.   At this point, pt is unable to consistently participate with therapy; Will pick up pt for PT on a trial basis, pending her ability to participate     PT Assessment  Patient needs continued PT services (on a trial basis, depending on ability to participate)    Follow Up Recommendations  SNF    Does the patient have the potential to tolerate intense rehabilitation      Barriers to Discharge        Equipment Recommendations  Rolling walker with 5" wheels;3in1 (PT)    Recommendations for Other Services     Frequency Min 1X/week    Precautions / Restrictions Precautions Precautions: Fall Restrictions Weight Bearing Restrictions: No   Pertinent Vitals/Pain Grimace with motion patient repositioned for comfort       Mobility  Bed Mobility Bed Mobility: Rolling Left;Supine to Sit;Sitting - Scoot to [place] of Bed;Sit to Supine Rolling Left: 1: +1 Total assist Supine to Sit: 1: +2 Total assist Supine to Sit: Patient Percentage: 0% Sitting - Scoot to Edge of Bed: 1: +2 Total assist Sitting - Scoot to Edge of Bed: Patient Percentage: 0% Sit to Supine: 1: +2 Total assist Sit to Supine: Patient Percentage: 0% Details for Bed Mobility Assistance: did not note any pt voluntary movement or muscle activation to help with rolling or supine to sit; Required one person to steady pt while second person  used pad to scoot hips to EOB to trial in supported and unsupported sitting at EOB Transfers Transfers: Not assessed    Exercises     PT Diagnosis: Generalized weakness;Difficulty walking;Acute pain  PT Problem List: Decreased strength;Decreased range of motion;Decreased activity tolerance;Decreased balance;Decreased mobility;Decreased coordination;Decreased cognition;Decreased knowledge of use of DME;Decreased safety awareness;Decreased skin integrity;Obesity;Pain PT Treatment Interventions: DME instruction;Gait training;Functional mobility training;Therapeutic activities;Therapeutic exercise;Balance training;Cognitive remediation;Neuromuscular re-education;Patient/family education     PT Goals(Current goals can be found in the care plan section) Acute Rehab PT Goals Patient Stated Goal: Unable to state PT Goal Formulation: Patient unable to participate in goal setting Time For Goal Achievement: 02/03/13 Potential to Achieve Goals: Fair Additional Goals Additional Goal #1: Pt will follow greater than or equal to 75% of one-step commands related to mobility  Visit Information  Last PT Received On: 01/20/13 Assistance Needed: +2 History of Present Illness: Pt admitted with decr level of consciousness, UTI, sepsis       Prior Functioning  Home Living Family/patient expects to be discharged to:: Skilled nursing facility Additional Comments: prior to admission to hospital in May lived alone and walked independently with cane or walker Prior Function Level of Independence: Needs assistance Gait / Transfers Assistance Needed: Walking well in therapy until about 2 weeks prior to admission Communication Communication: [facility]    Cognition  Cognition Arousal/Alertness: Lethargic Behavior During Therapy:  (Lethargic, responded with grimace to moving) Overall Cognitive Status: Impaired/Different from baseline Area of Impairment: Attention;Following commands Following Commands: Follows one  step commands inconsistently General Comments: Minimal  response to verbal stim, change in position from supine to sitting, noted occasional grimace; Minimal, but present eye-opening initially upon sitting up, lasting less than 5 seconds    Extremity/Trunk Assessment Upper Extremity Assessment Upper Extremity Assessment: Difficult to assess due to impaired cognition Lower Extremity Assessment Lower Extremity Assessment: RLE deficits/detail;LLE deficits/detail RLE Deficits / Details: No appreciable vonluntary movement observed; Bil LEs with significant edema, weeping clear fluid, and some blood as well; Bruising noted RLE: Unable to fully assess due to pain LLE Deficits / Details: No appreciable vonluntary movement observed; Bil LEs with significant edema, weeping clear fluid, and some blood as well; Bruising noted LLE: Unable to fully assess due to pain   Balance Balance Balance Assessed: Yes Static Sitting Balance Static Sitting - Balance Support: Bilateral upper extremity supported (hands placed at pt's side, with no appreciable supporting muscle activation) Static Sitting - Level of Assistance: 1: +1 Total assist;3: Mod assist Static Sitting - Comment/# of Minutes: EOB approx 3-4 minutes in a trial to see if pt became more responsive with postural change; minimal eye-opening noted upon initial sit; brief period of trunk activation with mod assist to maintan sitting (for approx 20 seconds); Noted occasionally pt seemed to be pushing back (?possibly with the intent to lay down, though pt was unable to verbalize)  End of Session PT - End of Session Equipment Utilized During Treatment: Other (comment) (bed pad and mutliple disposable "chuck" pads) Activity Tolerance: Patient limited by lethargy Patient left: in bed;with call bell/phone within reach Nurse Communication: Mobility status  GP     [facility] [place], [place] [phone]  01/20/2013, 9:27 AM

## 2013-01-20 NOTE — Consult Note (Signed)
WOC consult Note Reason for Consult:Bilateral LEs with ecchymotic areas and scattered purple demarcations; still with edema, albeit family states, "Better than with Unna's Boots".  Grand-daughter is with patient at this time.   Wound type: venous insufficiency Pressure Ulcer POA: No Measurement:Right LE with two areas of tissue trauma, one more distal measuring 10cm x 4cm and the proximal ulcer measuring 4cm x 9cm.  The left LE is with one area of tissue trauma measuring 5x5cm at the lateral LE.  There is dried serosanguinous fluid covering these three areas so it is not possible to determine the depth. Wound bed:As above. Drainage (amount, consistency, odor) Some weeping of fluid today, but not from ulcerated sites. Periwound:Hemosiderin staining plus scattered areas of tissue death and trauma Dressing procedure/placement/frequency:I will recommend conservative care to these areas and her LEs in general given the overall poor prognosis and failure of the wraps to make any difference over the past six months (according to family). RN staff to cleanse with NS, pat gently dry, then place Interdry Ag+ (an antimicrobial textile) over the LEs to absorb moisture and provide an antimicrobial covering that will not adhere. I will not follow, but will remain availabe to this patient, her family and her medical and nursing teams.  Please re-consult if needed. Thanks, Ladona Mow, MSN, RN, GNP, [place], CWON-AP [phone])

## 2013-01-20 NOTE — Progress Notes (Addendum)
[facility] Progress Note [place] TEAM 1 - Stepdown/ICU TEAM   [name]:[id number] DOB: October 23, 1923 DOA: 01/18/2013 PCP: Gretel Acre, MD  Brief narrative: [name] is a 79 y.o. 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  Patient minimally responsive to noxious stimuli and a such history was obtained from the patient's daughter and ED physician.  Per daughter patient was discharged from the hospital in the middle of May and sent to skilled nursing facility. Over the past 2 weeks prior to admission patient has been declining remarkably in terms of her cognition. One week prior to admission patient's daughter stated that patient was not lucid and had worsening mental status. Patient's daughter stated that she was called by the nursing home facility due to the mother being unresponsive with bradycardia and hypotension. EMS was called and patient was subsequently brought to the ED. In the ED patient was noted to be unresponsive, hypothermic, bradycardic, hypotensive and urinalysis consistent with a UTI. Basic metabolic profile obtained did show an elevated BUN and creatinine otherwise was within normal limits. Point-of-care troponin was negative. Lactic acid level is 1.0. Pro calcitonin level is 0.11. CBC obtained had a white count of 13.9, hemoglobin of 9.2 otherwise was within normal limits. Chest x-ray obtained showed low as 3 volumes with left greater than right basilar opacities. Patient was given IV vancomycin, IV aztreonam, IV Flagyl, IV ciprofloxacin.  Extensive conversation had with patient's family. According to the family the patient has been confused for the past 2 weeks and has not  been eating or drinking much. Though she was checked multiple times for urinary tract infection at the nursing facility, she was not found to have an infection.   Assessment/Plan: Principal Problem:   Encephalopathy acute - likely due to UTI- will follow with treatment - apparently also has underlying dementia  Active Problems:    Hypotension/dehydration - dehydration related in addition to sepsis - cont to hydrate  Severe anasarca/hypoalbuminemia - per family the patient has been having problems with pedal edema for the past couple of months and Unna boots were placed recently. -With IV hydration that was started yesterday on admission, she is noted to have swelling in bilateral upper extremities as well -I suspect it may be related to malnutrition    Chronic kidney disease-stage IV -Stable  Hyperkalemia -Continue to hydrate-this should help potassium improve-repeat potassium in a.m.    Hypothermia -Continue heating blankets-likely secondary to sepsis -Check TSH and free T4 to ensure this is not myxedema    UTI / Bactermia/ Sepsis - blood cultures positive- one set for gr neg rods and second for gr pos cocci -Continue current antibiotics and followup on culture    Bradycardia/A. fib with RVR -Patient is on Toprol as an outpatient which was held  due to hypotension and bradycardia -However had recurrent atrial fibrillation with a rapid rate -Will therefore, resume metoprolol via IV as patient is to unresponsive to take oral meds (at a low dose while watching blood pressure carefully)    Cellulitis of leg -I have  unwrapped her Unna boots and it appears that currently there is no cellulitis present. There are numerous shallow ulcers likely secondary to ruptured blebs. There is a significant amount of drainage of  clear liquid from her wounds. -At this time her family would like the Unna boots not to be replaced   Code Status: DO NOT RESUSCITATE  Family Communication:  Daughter/son-in-law and granddaughter  Disposition Plan: Follow in step down unit- hopefully can transfer out to SDU in AM  Consultants: None  Procedures: None  Antibiotics: Vancomycin and ciprofloxacin 7/4  DVT prophylaxis: On Coumadin with elevated INR  HPI/Subjective: Patient is unresponsive and intermittently moaning.   Objective: Blood pressure 118/52, pulse 66, temperature 97.7 F (36.5 C), temperature source Axillary, resp. rate 12, height 5\' 2"  (1.575 m), weight 116.3 kg (256 lb 6.3 oz), SpO2 98.00%.  Intake/Output Summary (Last 24 hours) at 01/20/13 1449 Last data filed at 01/20/13 1200  Gross per 24 hour  Intake   2500 ml  Output    425 ml  Net   2075 ml     Exam: General:  poorly responsive-occasionally moaning-No acute respiratory distress Lungs: Clear to auscultation bilaterally without wheezes or crackles Cardiovascular: Iregular rate and rhythm without murmur gallop or rub normal S1 and S2 Abdomen: Nontender, nondistended, soft, bowel sounds positive, no rebound, no ascites, no appreciable mass Extremities: No significant cyanosis, clubbing-there is significant edema and  edema bilateral lower extremities and upper extremity Skin: Significant shallow ulcers on lower extremities with oozing of clear fluid  Data Reviewed: Basic Metabolic Panel:  Recent Labs Lab 01/18/13 1141 01/18/13 1938 01/19/13 0545 01/20/13 0423  NA 141  --  144 147*  K 5.0  --  5.7* 5.5*  CL 109  --  116* 120*  CO2 21  --  16* 18*  GLUCOSE 93  --  90 88  BUN 68*  --  65* 68*  CREATININE 1.99*  --  2.00* 2.09*  CALCIUM 9.3  --  8.5 8.8  MG  --  1.7  --   --   PHOS  --  4.5  --   --    Liver Function Tests:  Recent Labs Lab 01/18/13 1141 01/19/13 0545  AST 29 23  ALT 33 26  ALKPHOS 76 71  BILITOT 0.2* 0.2*  PROT 6.0 5.4*  ALBUMIN 2.7* 2.3*   No results found for this basename: LIPASE, AMYLASE,  in the last 168 hours No results found for this basename: AMMONIA,   in the last 168 hours CBC:  Recent Labs Lab 01/18/13 1141 01/19/13 0545 01/20/13 0423  WBC 13.9* 12.4* 9.3  NEUTROABS 12.7*  --   --   HGB 9.2* 8.4* 8.3*  HCT 27.4* 25.7* 25.3*  MCV 94.8 95.2 95.5  PLT 173 163 167   Cardiac Enzymes: No results found for this basename: CKTOTAL, CKMB, CKMBINDEX, TROPONINI,  in the last 168 hours BNP (last 3 results) No results found for this basename: PROBNP,  in the last 8760 hours CBG:  Recent Labs Lab 01/18/13 1202  GLUCAP 99    Recent Results (from the past 240 hour(s))  URINE CULTURE     Status: None   Collection Time    01/18/13 11:48 AM      Result Value Range Status   Specimen Description URINE, RANDOM   Final   Special Requests NONE   Final   Culture  Setup Time 01/18/2013 19:13   Final   Colony Count >=100,000 COLONIES/ML   Final   Culture     Final   Value: Multiple bacterial morphotypes present, none predominant. Suggest appropriate recollection if clinically indicated.   Report Status 01/20/2013 FINAL   Final  CULTURE, BLOOD (ROUTINE X 2)     Status: None   Collection Time    01/18/13 12:15 PM      Result Value Range Status   Specimen Description BLOOD ARM LEFT   Final   Special Requests BOTTLES DRAWN AEROBIC AND ANAEROBIC 10CC   Final   Culture  Setup Time 01/18/2013 18:48   Final   Culture     Final   Value: GRAM NEGATIVE RODS     Note: Gram Stain Report Called to,Read Back By and Verified With: WILLIAM RICE 01/19/13 @ 9:23PM BY RUSCOE A.   Report Status PENDING   Incomplete  CULTURE, BLOOD (ROUTINE X 2)     Status: None   Collection Time    01/18/13 12:20 PM      Result Value Range Status   Specimen Description BLOOD ARM RIGHT   Final   Special Requests BOTTLES DRAWN AEROBIC ONLY 5CC   Final   Culture  Setup Time 01/18/2013 18:48   Final   Culture     Final   Value: GRAM POSITIVE COCCI IN CLUSTERS     Note: Gram Stain Report Called to,Read Back By and Verified With: Asencion Gowda RN on 01/20/13 at 00:55 by Christie Nottingham   Report Status PENDING   Incomplete  MRSA PCR SCREENING     Status: None   Collection Time    01/18/13  4:24 PM      Result Value Range Status   MRSA by PCR NEGATIVE  NEGATIVE Final   Comment:            The GeneXpert MRSA Assay (FDA     approved for NASAL specimens     only), is one component of a     comprehensive MRSA colonization     surveillance program. It is not     intended to diagnose MRSA     infection nor to guide or     monitor treatment for     MRSA infections.     Studies:  Recent x-ray studies have been reviewed in detail by the Attending Physician  Scheduled Meds:  Scheduled Meds: . aztreonam  1 g Intravenous Q8H  . ciprofloxacin  400 mg Intravenous Once  . ciprofloxacin  400 mg Intravenous Q24H  . metoprolol  5 mg Intravenous Q6H  . neomycin-bacitracin-polymyxin   Topical BID  . pantoprazole (PROTONIX) IV  40 mg Intravenous Q24H  . sodium chloride  3 mL Intravenous Q12H  . vancomycin  1,500 mg Intravenous Q48H   Continuous Infusions: . sodium chloride 100 mL/hr at 01/20/13 8413    Time spent on care of this patient: 35 minutes   Tovia Kisner,MD  [facility] Office  [phone] Pager - Text Page per Loretha Stapler as per below:  On-Call/Text Page:      [email or web address]      password [email or web address]  If 7PM-7AM, please contact night-coverage [email or web address] Password [email or web address] 01/20/2013, 2:49 PM   LOS: 2 days

## 2013-01-21 LAB — CULTURE, BLOOD (ROUTINE X 2)

## 2013-01-21 MED ORDER — MORPHINE SULFATE 2 MG/ML IJ SOLN
1.0000 mg | INTRAMUSCULAR | Status: DC | PRN
  Administered 2013-01-21 – 2013-01-29 (×20): 2 mg via INTRAVENOUS
  Filled 2013-01-21 (×21): qty 1

## 2013-01-21 MED ORDER — IMIPENEM-CILASTATIN 250 MG IV SOLR
250.0000 mg | Freq: Four times a day (QID) | INTRAVENOUS | Status: DC
  Filled 2013-01-21 (×3): qty 250

## 2013-01-21 MED ORDER — DEXTROSE 5 % IV SOLN
INTRAVENOUS | Status: DC
  Administered 2013-01-21: 14:00:00 via INTRAVENOUS

## 2013-01-21 NOTE — Progress Notes (Signed)
[facility] Progress Note [place] TEAM 1 - Stepdown/ICU TEAM   [name] VWU:[id number] DOB: June 23, 1924 DOA: 01/18/2013 PCP: Gretel Acre, MD  Brief narrative: 79 y.o. 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 Patient was minimally responsive to noxious stimuli and as such history was obtained from the patient's daughter and ED physician.   Per the daughter, the patient was discharged from the hospital in the middle of May and sent to a skilled nursing facility. Over the 2 weeks prior to admission patient had been declining remarkably in terms of her cognition. One week prior to admission patient's daughter stated that patient was not lucid and had worsening mental status. Patient's daughter stated that she was called by the nursing home facility due to the mother being unresponsive with bradycardia and hypotension. EMS was called and patient was subsequently brought to the ED. In the ED the patient was also noted to be hypothermic. Her urinalysis was consistent with a possible UTI. Basic metabolic profile revealed an elevated BUN and creatinine but otherwise was within normal limits. POC troponin was negative. Lactic acid level was 1.0. Pro calcitonin level was 0.11. WBC was 13.9 and the hemoglobin was 9.2. Chest x-ray revealed left greater than right basilar opacities. Patient was given IV vancomycin, IV aztreonam, IV Flagyl, IV ciprofloxacin in the ER.   Extensive conversation with patient's family at time of admission. According to the family the patient has been confused for the past 2 weeks and has not been eating or drinking much. Though she was checked multiple times for urinary tract  infection at the nursing facility, she was not found to have an infection.   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 Cellulitis of leg -Unna boots have been removed and currently there are no signs of cellulitis present.  -There are numerous shallow ulcers likely secondary to ruptured blebs. There is a significant amount of drainage of clear liquid from her wounds. -WOC RN following -At this time her family would like the Unna boots not to be replaced -wounds appear to be painful     Hypotension/dehydration - dehydration related in addition to sepsis - cont to hydrate but since normotensive will decrease rate to 50/hr    Severe anasarca/hypoalbuminemia - per family the patient has been having problems with pedal edema for the past couple of months and Unna boots were placed recently. -With IV hydration she is noted to have swelling in bilateral upper  extremities as well -related to malnutrition    ?? OSA/right ventricular dilatation -ECHO 2006 revealed moderate to severe Right ventricular dilatation and TR - suspect due to underlying OSA -repeat ECHO this  admit pending -likely explains chronic edema    Acute renal failure on Chronic kidney disease-stage IV -Stable -still appears mildly azotemic and now with mild hypernatremia so will change IVF to D5W   Hyperkalemia -Continue to hydrate-this should help potassium improve-repeat potassium in a.m.    Mild Hypothermia -Continue heating blankets - likely secondary to sepsis -TSH not remarkable     Bradycardia / A. fib with RVR -on Toprol pre admit but was held due to hypotension and bradycardia -had recurrent atrial fibrillation with a rapid rate after admit so IV Lopressor initiated -currently with mild warfarin induced coagulopathy - pharmacy dosing    Code Status: DO NOT RESUSCITATE  Family Communication: Daughter via telephone Disposition Plan: Transfer to floor  Consultants: None  Procedures: 2D ECHO pending  Antibiotics: Vancomycin and ciprofloxacin 7/4 >>>  DVT prophylaxis: Coumadin   HPI/Subjective: Patient was unresponsive and intermittently moaning this am but later became agitated and according to RN appeared to be in pain.   Objective: Blood pressure 129/60, pulse 60, temperature 97.7 F (36.5 C), temperature source Oral, resp. rate 13, height 5\' 2"  (1.575 m), weight 115 kg (253 lb 8.5 oz), SpO2 99.00%.  Intake/Output Summary (Last 24 hours) at 01/21/13 1306 Last data filed at 01/21/13 4403  Gross per 24 hour  Intake   1250 ml  Output    675 ml  Net    575 ml    Exam: General:  poorly responsive-occasionally moaning-no acute respiratory distress Lungs: Clear to auscultation bilaterally without wheezes or crackles Cardiovascular: Irregular rate and rhythm without murmur gallop or rub normal S1 and S2 Abdomen: Nontender, nondistended, soft, bowel sounds positive, no rebound, no ascites, no appreciable mass Extremities: No significant cyanosis, clubbing-there is significant edema bilateral lower extremities and upper extremity Skin: Significant shallow  ulcers on lower extremities with oozing of clear fluid-areas of vertical linear bruising c/w prior UNNA boot application  Data Reviewed: Basic Metabolic Panel:  Recent Labs Lab 01/18/13 1141 01/18/13 1938 01/19/13 0545 01/20/13 0423  NA 141  --  144 147*  K 5.0  --  5.7* 5.5*  CL 109  --  116* 120*  CO2 21  --  16* 18*  GLUCOSE 93  --  90 88  BUN 68*  --  65* 68*  CREATININE 1.99*  --  2.00* 2.09*  CALCIUM 9.3  --  8.5 8.8  MG  --  1.7  --   --   PHOS  --  4.5  --   --    Liver Function Tests:  Recent Labs Lab 01/18/13 1141 01/19/13 0545  AST 29 23  ALT 33 26  ALKPHOS 76 71  BILITOT 0.2* 0.2*  PROT 6.0 5.4*  ALBUMIN 2.7* 2.3*   CBC:  Recent Labs Lab 01/18/13 1141 01/19/13 0545 01/20/13 0423  WBC 13.9* 12.4* 9.3  NEUTROABS 12.7*  --   --   HGB 9.2* 8.4* 8.3*  HCT 27.4* 25.7* 25.3*  MCV 94.8 95.2 95.5  PLT 173 163 167   CBG:  Recent Labs Lab 01/18/13 1202  GLUCAP 99    Recent Results (from the past 240 hour(s))  URINE CULTURE     Status: None   Collection Time    01/18/13 11:48 AM      Result Value Range  Status   Specimen Description URINE, RANDOM   Final   Special Requests NONE   Final   Culture  Setup Time 01/18/2013 19:13   Final   Colony Count >=100,000 COLONIES/ML   Final   Culture     Final   Value: Multiple bacterial morphotypes present, none predominant. Suggest appropriate recollection if clinically indicated.   Report Status 01/20/2013 FINAL   Final  CULTURE, BLOOD (ROUTINE X 2)     Status: None   Collection Time    01/18/13 12:15 PM      Result Value Range Status   Specimen Description BLOOD ARM LEFT   Final   Special Requests BOTTLES DRAWN AEROBIC AND ANAEROBIC 10CC   Final   Culture  Setup Time 01/18/2013 18:48   Final   Culture     Final   Value: PSEUDOMONAS AERUGINOSA     Note: Gram Stain Report Called to,Read Back By and Verified With: WILLIAM RICE 01/19/13 @ 9:23PM BY RUSCOE A.   Report Status PENDING   Incomplete   Organism  ID, Bacteria PSEUDOMONAS AERUGINOSA   Final  CULTURE, BLOOD (ROUTINE X 2)     Status: None   Collection Time    01/18/13 12:20 PM      Result Value Range Status   Specimen Description BLOOD ARM RIGHT   Final   Special Requests BOTTLES DRAWN AEROBIC ONLY 5CC   Final   Culture  Setup Time 01/18/2013 18:48   Final   Culture     Final   Value: STAPHYLOCOCCUS SPECIES (COAGULASE NEGATIVE)     Note: THE SIGNIFICANCE OF ISOLATING THIS ORGANISM FROM A SINGLE SET OF BLOOD CULTURES WHEN MULTIPLE SETS ARE DRAWN IS UNCERTAIN. PLEASE NOTIFY THE MICROBIOLOGY DEPARTMENT WITHIN ONE WEEK IF SPECIATION AND SENSITIVITIES ARE REQUIRED.     Note: Gram Stain Report Called to,Read Back By and Verified With: Asencion Gowda RN on 01/20/13 at 00:55 by Christie Nottingham   Report Status 01/21/2013 FINAL   Final  MRSA PCR SCREENING     Status: None   Collection Time    01/18/13  4:24 PM      Result Value Range Status   MRSA by PCR NEGATIVE  NEGATIVE Final   Comment:            The GeneXpert MRSA Assay (FDA     approved for NASAL specimens     only), is one component of a     comprehensive MRSA colonization     surveillance program. It is not     intended to diagnose MRSA     infection nor to guide or     monitor treatment for     MRSA infections.     Studies:  Recent x-ray studies have been reviewed in detail by the Attending Physician  Scheduled Meds:  Scheduled Meds: . aztreonam  1 g Intravenous Q8H  . ciprofloxacin  400 mg Intravenous Once  . ciprofloxacin  400 mg Intravenous Q24H  . metoprolol  5 mg Intravenous Q6H  . neomycin-bacitracin-polymyxin   Topical BID  . pantoprazole (PROTONIX) IV  40 mg Intravenous Q24H  . sodium chloride  3 mL Intravenous Q12H  . vancomycin  1,500 mg Intravenous Q48H    Time spent on care of this patient: 35 minutes   ELLIS,ALLISON L. ANP  [facility] Office  [phone] Pager - Text Page per Loretha Stapler as per below:  On-Call/Text Page:      [email or web address]  password [email or web address]  If 7PM-7AM, please contact night-coverage [email or web address] Password [email or web address] 01/21/2013, 1:06 PM   LOS: 3 days    I have personally examined this patient and reviewed the entire database. I have reviewed the above note, made any necessary editorial changes, and agree with its content.  Lonia Blood, MD [facility]

## 2013-01-21 NOTE — Progress Notes (Signed)
Utilization review completed.  

## 2013-01-21 NOTE — Progress Notes (Signed)
Attempted to see pt. Nursing stated pt in too much pain.   Will attempt back as schedule allows. Tory Emerald, [place] [phone]

## 2013-01-21 NOTE — Progress Notes (Signed)
PT Cancellation Note  Patient Details Name: RHILEY TARVER MRN: [id number] DOB: 18-Sep-1923   Cancelled Treatment:    Reason Eval/Treat Not Completed: Pain limiting ability to participate (Per OT note, RN requesting to hold therapy secondary to pain);  Will continue to follow on a trial basis, depending upon pt's ability to participate; Thank you,  Van Clines, [place] [phone]    Van Clines [facility] 01/21/2013, 12:27 PM

## 2013-01-21 NOTE — Evaluation (Signed)
Clinical/Bedside Swallow Evaluation Patient Details  Name: [name] MRN: [id number] Date of Birth: August 09, 1923  Today's Date: 01/21/2013 Time: 4098-1191 SLP Time Calculation (min): 11 min  Past Medical History:  Past Medical History  Diagnosis Date  . Hypertension   . Swelling of left lower extremity chronic  . Swelling of right lower extremity chronic  . Urinary incontinence   . PE (pulmonary embolism) 2006  . DVT (deep venous thrombosis) 2006  . Hypothyroidism   . Renal disorder   . Spinal stenosis     L4, L5  . Solitary kidney 01/18/2013   Past Surgical History:  Past Surgical History  Procedure Laterality Date  . Abdominal hysterectomy    . Appendectomy    . Knee surgery     HPI:  79 y.o. female is a resident at the skilled nursing facility with a history of hypertension, chronic lower extremity cellulitis with Unna boots, history of PE/DVT on chronic anticoagulation, solitary kidney, chronic kidney disease who presents to the ED with progressive worsening level of consciousness and unresponsiveness. Per daughter patient has been declining remarkably in terms of her cognition. EMS was called and patient was subsequently brought to the ED. In the ED patient was noted to be unresponsive, hypothermic, bradycardic, hypotensive and urinalysis consistent with a UTI.   CT 7/4 No acute abnormality.  Ct Low inspiratory volumes with left greater than right basilar opacities which may reflect atelectasis, infiltrate or early asymmetric pulmonary edema.   Assessment / Plan / Recommendation Clinical Impression  Pt. lethargic, just beginning to arouse last night for first time since admit per son and grandaughter.  Pt. followed several oral-motor commands and allowed SLP to perform oral hygiene before falling to sleep.  Pt. not appropriate to attempt po's at present due to decreased level of alertness (although improving).    SLP will see pt. next date for ability to consume po's in  diagnostic treatment.      Aspiration Risk  Severe    Diet Recommendation NPO        Other  Recommendations Oral Care Recommendations: Oral care BID   Follow Up Recommendations  Skilled Nursing facility    Frequency and Duration min 2x/week  2 weeks   Pertinent Vitals/Pain No indiacations    SLP Swallow Goals Goal #3: Pt. will consume solid texture and thin liquid to determine safest diet versus objective study if appropriate with mod verbal cues.   Swallow Study       Oral/Motor/Sensory Function Overall Oral Motor/Sensory Function:  (will assess when more alert)   Ice Chips Ice chips: Not tested   Thin Liquid Thin Liquid: Not tested    Nectar Thick Nectar Thick Liquid: Not tested   Honey Thick Honey Thick Liquid: Not tested   Puree Puree: Not tested   Solid   GO    Solid: Not tested       Royce Macadamia M.Ed [employer] [phone]  01/21/2013

## 2013-01-21 NOTE — Progress Notes (Signed)
ANTIBIOTIC CONSULT NOTE - FOLLOW UP  Pharmacy Consult for Imipenem and Aztreonam Indication: Pseudomonas Bacteremia  Allergies  Allergen Reactions  . Penicillins Anaphylaxis  . Codeine     Verified with daughter- N/V "violently ill"    Patient Measurements: Height: 5\' 2"  (157.5 cm) Weight: 253 lb 8.5 oz (115 kg) IBW/kg (Calculated) : 50.1  Vital Signs: Temp: 97.6 F (36.4 C) (07/07 1419) Temp src: Oral (07/07 1419) BP: 123/57 mmHg (07/07 1419) Pulse Rate: 60 (07/07 1419) Intake/Output from previous day: 07/06 0701 - 07/07 0700 In: 1750 [I.V.:1000; IV Piggyback:750] Out: 650 [Urine:650] Intake/Output from this shift: Total I/O In: -  Out: 300 [Urine:300]  Labs:  Recent Labs  01/19/13 0545 01/20/13 0423  WBC 12.4* 9.3  HGB 8.4* 8.3*  PLT 163 167  CREATININE 2.00* 2.09*   Estimated Creatinine Clearance: 22.8 ml/min (by C-G formula based on Cr of 2.09). No results found for this basename: VANCOTROUGH, Leodis Binet, VANCORANDOM, GENTTROUGH, GENTPEAK, GENTRANDOM, TOBRATROUGH, TOBRAPEAK, TOBRARND, AMIKACINPEAK, AMIKACINTROU, AMIKACIN,  in the last 72 hours   Microbiology: Recent Results (from the past 720 hour(s))  URINE CULTURE     Status: None   Collection Time    01/18/13 11:48 AM      Result Value Range Status   Specimen Description URINE, RANDOM   Final   Special Requests NONE   Final   Culture  Setup Time 01/18/2013 19:13   Final   Colony Count >=100,000 COLONIES/ML   Final   Culture     Final   Value: Multiple bacterial morphotypes present, none predominant. Suggest appropriate recollection if clinically indicated.   Report Status 01/20/2013 FINAL   Final  CULTURE, BLOOD (ROUTINE X 2)     Status: None   Collection Time    01/18/13 12:15 PM      Result Value Range Status   Specimen Description BLOOD ARM LEFT   Final   Special Requests BOTTLES DRAWN AEROBIC AND ANAEROBIC 10CC   Final   Culture  Setup Time 01/18/2013 18:48   Final   Culture     Final    Value: PSEUDOMONAS AERUGINOSA     Note: Gram Stain Report Called to,Read Back By and Verified With: WILLIAM RICE 01/19/13 @ 9:23PM BY RUSCOE A.   Report Status PENDING   Incomplete   Organism ID, Bacteria PSEUDOMONAS AERUGINOSA   Final  CULTURE, BLOOD (ROUTINE X 2)     Status: None   Collection Time    01/18/13 12:20 PM      Result Value Range Status   Specimen Description BLOOD ARM RIGHT   Final   Special Requests BOTTLES DRAWN AEROBIC ONLY 5CC   Final   Culture  Setup Time 01/18/2013 18:48   Final   Culture     Final   Value: STAPHYLOCOCCUS SPECIES (COAGULASE NEGATIVE)     Note: THE SIGNIFICANCE OF ISOLATING THIS ORGANISM FROM A SINGLE SET OF BLOOD CULTURES WHEN MULTIPLE SETS ARE DRAWN IS UNCERTAIN. PLEASE NOTIFY THE MICROBIOLOGY DEPARTMENT WITHIN ONE WEEK IF SPECIATION AND SENSITIVITIES ARE REQUIRED.     Note: Gram Stain Report Called to,Read Back By and Verified With: Asencion Gowda RN on 01/20/13 at 00:55 by Christie Nottingham   Report Status 01/21/2013 FINAL   Final  MRSA PCR SCREENING     Status: None   Collection Time    01/18/13  4:24 PM      Result Value Range Status   MRSA by PCR NEGATIVE  NEGATIVE Final   Comment:  The GeneXpert MRSA Assay (FDA     approved for NASAL specimens     only), is one component of a     comprehensive MRSA colonization     surveillance program. It is not     intended to diagnose MRSA     infection nor to guide or     monitor treatment for     MRSA infections.    Anti-infectives   Start     Dose/Rate Route Frequency Ordered Stop   01/20/13 1600  vancomycin (VANCOCIN) 1,500 mg in sodium chloride 0.9 % 500 mL IVPB  Status:  Discontinued     1,500 mg 250 mL/hr over 120 Minutes Intravenous Every 48 hours 01/18/13 1446 01/21/13 1407   01/19/13 2200  aztreonam (AZACTAM) 1 g in dextrose 5 % 50 mL IVPB     1 g 100 mL/hr over 30 Minutes Intravenous 3 times per day 01/19/13 2136     01/19/13 1400  ciprofloxacin (CIPRO) IVPB 400 mg     400 mg 200 mL/hr  over 60 Minutes Intravenous Every 24 hours 01/18/13 1446     01/18/13 2200  metroNIDAZOLE (FLAGYL) IVPB 500 mg  Status:  Discontinued     500 mg 100 mL/hr over 60 Minutes Intravenous Every 8 hours 01/18/13 1300 01/18/13 1635   01/18/13 2200  aztreonam (AZACTAM) 1 g in dextrose 5 % 50 mL IVPB  Status:  Discontinued     1 g 100 mL/hr over 30 Minutes Intravenous 3 times per day 01/18/13 1446 01/18/13 1635   01/18/13 1400  vancomycin (VANCOCIN) 2,000 mg in sodium chloride 0.9 % 500 mL IVPB     2,000 mg 250 mL/hr over 120 Minutes Intravenous To Emergency Dept 01/18/13 1300 01/18/13 1712   01/18/13 1400  ciprofloxacin (CIPRO) IVPB 400 mg     400 mg 200 mL/hr over 60 Minutes Intravenous  Once 01/18/13 1358     01/18/13 1145  aztreonam (AZACTAM) 2 g in dextrose 5 % 50 mL IVPB     2 g 100 mL/hr over 30 Minutes Intravenous  Once 01/18/13 1141 01/18/13 1356   01/18/13 1145  metroNIDAZOLE (FLAGYL) IVPB 500 mg     500 mg 100 mL/hr over 60 Minutes Intravenous  Once 01/18/13 1141 01/18/13 1512   01/18/13 1145  vancomycin (VANCOCIN) IVPB 1000 mg/200 mL premix  Status:  Discontinued     1,000 mg 200 mL/hr over 60 Minutes Intravenous  Once 01/18/13 1141 01/18/13 1258      Assessment: 79 year old female admitted with UTI and sepsis, now found to have Pseudomonas bacteremia.  She is currently being treated with Aztreonam and Cipro - the isolate is Cipro-resistant and may also be Aztreonam-resistant per discussion with the Micro tech.  A manual E-test is being done and results will be available 7/8.  Given the potential for possible resistance to both antibiotics her therapy requires re-evaluation.  Her situation is complicated by a history of anaphylaxis with penicillins.  As the only antibiotics we know to be effective for her isolate all have the potential for cross-reactivity, I discussed her case with Junious Silk, NP.  Imipenem was selected due to a cross-reactivity potential of <10%.    Plan:   Imipenem 250mg  IV q6h Revonda Standard has issued orders for monitoring with her first imipenem dose for anaphylaxis. Continue Aztreonam 1gm IV q8h Monitor renal function  Await further susceptibility data  Estella Husk, Pharm.D., BCPS, AAHIVP Clinical Pharmacist Phone: [phone] or [phone] Pager: [phone] 01/21/2013, 2:46  PM   

## 2013-01-21 NOTE — Progress Notes (Signed)
INITIAL NUTRITION ASSESSMENT  DOCUMENTATION CODES Per approved criteria  -Morbid Obesity   INTERVENTION:  Diet advancement as mental status improves per MD. Will likely need PO supplements to help meet nutrition needs.  NUTRITION DIAGNOSIS: Inadequate oral intake related to altered mental status as evidenced by NPO diet and minimal oral intake for 2 weeks PTA.   Goal: Intake to meet >90% of estimated nutrition needs.  Monitor:  Diet advancement, PO intake, labs, weight trend.  Reason for Assessment: MST=5  79 y.o. female  Admitting Dx: Encephalopathy acute; Severe sepsis; UTI; Hypothermia  ASSESSMENT: Patient presented to the ED with progressive worsening level of consciousness and unresponsiveness. Found to have a UTI. For the past 2 weeks has had worsening mental status with minimal food and beverage intake. Per discussion with family members in room with patient, patient has had swelling of her LE related to chronic LE cellulitis for the past few months. WOC RN consulted for venous stasis wounds to LE. Patient remains NPO at this time. She is at nutrition risk given current NPO status with minimal oral intake for the past 2 weeks.   Height: Ht Readings from Last 1 Encounters:  01/18/13 5\' 2"  (1.575 m)    Weight: Wt Readings from Last 1 Encounters:  01/21/13 253 lb 8.5 oz (115 kg)    Ideal Body Weight: 50 kg  % Ideal Body Weight: 230%  Wt Readings from Last 10 Encounters:  01/21/13 253 lb 8.5 oz (115 kg)  11/29/12 242 lb 11.2 oz (110.088 kg)    Usual Body Weight: 242-245 lb  % Usual Body Weight: 104%  BMI:  Body mass index is 46.36 kg/(m^2). morbid obesity  Estimated Nutritional Needs: Kcal: 1600-1800 Protein: 90-100 gm Fluid: 1.7-2 L  Skin: venous insufficiency wounds to bilateral LE  Diet Order: NPO  EDUCATION NEEDS: -Education not appropriate at this time   Intake/Output Summary (Last 24 hours) at 01/21/13 1151 Last data filed at 01/21/13  0718  Gross per 24 hour  Intake   1650 ml  Output    825 ml  Net    825 ml    Last BM: none documented   Labs:   Recent Labs Lab 01/18/13 1141 01/18/13 1938 01/19/13 0545 01/20/13 0423  NA 141  --  144 147*  K 5.0  --  5.7* 5.5*  CL 109  --  116* 120*  CO2 21  --  16* 18*  BUN 68*  --  65* 68*  CREATININE 1.99*  --  2.00* 2.09*  CALCIUM 9.3  --  8.5 8.8  MG  --  1.7  --   --   PHOS  --  4.5  --   --   GLUCOSE 93  --  90 88    CBG (last 3)   Recent Labs  01/18/13 1202  GLUCAP 99    Scheduled Meds: . aztreonam  1 g Intravenous Q8H  . ciprofloxacin  400 mg Intravenous Once  . ciprofloxacin  400 mg Intravenous Q24H  . metoprolol  5 mg Intravenous Q6H  . neomycin-bacitracin-polymyxin   Topical BID  . pantoprazole (PROTONIX) IV  40 mg Intravenous Q24H  . sodium chloride  3 mL Intravenous Q12H  . vancomycin  1,500 mg Intravenous Q48H    Continuous Infusions: . sodium chloride 100 mL/hr at 01/21/13 0600    Past Medical History  Diagnosis Date  . Hypertension   . Swelling of left lower extremity chronic  . Swelling of right lower extremity  chronic  . Urinary incontinence   . PE (pulmonary embolism) 2006  . DVT (deep venous thrombosis) 2006  . Hypothyroidism   . Renal disorder   . Spinal stenosis     L4, L5  . Solitary kidney 01/18/2013    Past Surgical History  Procedure Laterality Date  . Abdominal hysterectomy    . Appendectomy    . Knee surgery       Joaquin Courts, RD, LDN, CNSC Pager [phone] After Hours Pager [phone]

## 2013-01-21 NOTE — Progress Notes (Signed)
Pt to TX to 4N-21. Called report. Family present & aware.

## 2013-01-22 ENCOUNTER — Inpatient Hospital Stay (HOSPITAL_COMMUNITY): Payer: Medicare Other

## 2013-01-22 DIAGNOSIS — L02419 Cutaneous abscess of limb, unspecified: Secondary | ICD-10-CM

## 2013-01-22 DIAGNOSIS — I059 Rheumatic mitral valve disease, unspecified: Secondary | ICD-10-CM

## 2013-01-22 DIAGNOSIS — E8770 Fluid overload, unspecified: Secondary | ICD-10-CM | POA: Clinically undetermined

## 2013-01-22 DIAGNOSIS — R791 Abnormal coagulation profile: Secondary | ICD-10-CM | POA: Diagnosis present

## 2013-01-22 LAB — COMPREHENSIVE METABOLIC PANEL
Alkaline Phosphatase: 67 U/L (ref 39–117)
BUN: 64 mg/dL — ABNORMAL HIGH (ref 6–23)
Chloride: 119 mEq/L — ABNORMAL HIGH (ref 96–112)
GFR calc Af Amer: 23 mL/min — ABNORMAL LOW (ref >90)
Glucose, Bld: 108 mg/dL — ABNORMAL HIGH (ref 70–99)
Potassium: 4.9 mEq/L (ref 3.5–5.1)
Total Bilirubin: 0.2 mg/dL — ABNORMAL LOW (ref 0.3–1.2)

## 2013-01-22 LAB — CBC
MCV: 96.5 fL (ref 78.0–100.0)
Platelets: 167 10*3/uL (ref 150–400)
RDW: 15.3 % (ref 11.5–15.5)
WBC: 8.5 10*3/uL (ref 4.0–10.5)

## 2013-01-22 MED ORDER — SODIUM CHLORIDE 0.9 % IJ SOLN
10.0000 mL | INTRAMUSCULAR | Status: DC | PRN
  Administered 2013-01-25: 10 mL

## 2013-01-22 MED ORDER — DIPHENHYDRAMINE HCL 50 MG/ML IJ SOLN
12.5000 mg | Freq: Four times a day (QID) | INTRAMUSCULAR | Status: DC | PRN
  Administered 2013-01-23 – 2013-01-26 (×2): 12.5 mg via INTRAVENOUS
  Filled 2013-01-22 (×2): qty 1

## 2013-01-22 MED ORDER — FUROSEMIDE 10 MG/ML IJ SOLN
20.0000 mg | Freq: Two times a day (BID) | INTRAMUSCULAR | Status: DC
Start: 2013-01-22 — End: 2013-01-29
  Administered 2013-01-22 – 2013-01-29 (×15): 20 mg via INTRAVENOUS
  Filled 2013-01-22 (×20): qty 2

## 2013-01-22 NOTE — Progress Notes (Signed)
Patient began scratching right side of neck severely this morning. Skin dry and was beginning to become reddened and irritated. Green mit applied to left hand only to protect patient's skin. Passed along to day shift.

## 2013-01-22 NOTE — Progress Notes (Signed)
Echo Lab  2D Echocardiogram completed.  [name] Berdena Cisek, RDCS 01/22/2013 11:12 AM

## 2013-01-22 NOTE — Progress Notes (Signed)
Speech Language Pathology Dysphagia Treatment Patient Details Name: [name] MRN: [id number] DOB: [birth year] Today's Date: 01/22/2013 Time: 4098-1191 SLP Time Calculation (min): 21 min  Assessment / Plan / Recommendation Clinical Impression  Pt still not appropriate for POs given lethargy and AMS. Pt was not appropriately aware of ice chip trial, no swallow response. Daughter reports she was more alert yesterday afternoon. SLP may return later today to reattempt. Daughter also reports history of oral cancer with mild oral dysphagia. Pt requires straws, chews on left side (no dentition on right). Apparently cancer located in right jaw only.     Diet Recommendation  Continue with Current Diet: NPO    SLP Plan Continue with current plan of care   Pertinent Vitals/Pain NA   Swallowing Goals  SLP Swallowing Goals Goal #3: Pt. will consume solid texture and thin liquid to determine safest diet versus objective study if appropriate with mod verbal cues. Swallow Study Goal #3 - Progress: Progressing toward goal  General Temperature Spikes Noted: No Respiratory Status: Room air Behavior/Cognition: Lethargic;Doesn't follow directions Oral Cavity - Dentition: Missing dentition Patient Positioning: Upright in bed  Oral Cavity - Oral Hygiene Patient is HIGH RISK - Oral Care Protocol followed (see row info): Yes   Dysphagia Treatment Treatment focused on: Upgraded PO texture trials;Patient/family/caregiver education;Facilitation of oral preparatory phase Family/Caregiver Educated: daughter Treatment Methods/Modalities: Skilled observation;Differential diagnosis Patient observed directly with PO's: Yes Type of PO's observed: Ice chips Feeding: Total assist Liquids provided via: Teaspoon Oral Phase Signs & Symptoms: Left pocketing;Prolonged oral phase Type of cueing: Verbal;Tactile Amount of cueing: Maximal   GO    Harlon Ditty, MA CCC-SLP [phone]  [name] 01/22/2013, 9:34 AM

## 2013-01-22 NOTE — Progress Notes (Signed)
Occupational Therapy Evaluation Patient Details Name: MARLISHA VANWYK MRN: [id number] DOB: Dec 06, 1923 Today's Date: 01/22/2013 Time: 7829-5621 OT Time Calculation (min): 24 min  OT Assessment / Plan / Recommendation History of present illness Pt admitted with decr level of consciousness, UTI, sepsis, encephalopathy, and still with foot ulcers and cellulitis   Clinical Impression   Pt 79 y o F with cellulitis. Pt very hard to arouse. With verbal and tactile stimulation arousal was minimal at bed. Pt did not respond to warm washcloth in hand to wash face. Pt's BUE ranged, and they were noticeably tight and holding flexion. Pt repositioned.  Rt arms appears to have clear drainage near elbow. Pt will benefit from skilled OT services while in the acute setting.    OT Assessment  Patient needs continued OT Services    Follow Up Recommendations  Other (comment) (To bedetermined depending on progress)             Frequency  Min 2X/week    Precautions / Restrictions Precautions Precautions: Fall Precaution Comments: legs have open sores on them and are very sensitive to touch Restrictions Weight Bearing Restrictions: No   Pertinent Vitals/Pain Pt unable to verbally report pain, grimaced when legs were repositioned.    ADL  Transfers/Ambulation Related to ADLs: transfer and ambulation not attempted this session ADL Comments: Pt was supine and lethargic upon entering. Pt's daughter was bedside. Pt was minimally aroused by auditory and tactile input. Pt's arms and hands were ranged. Pt pulled against OTS and OT into flexion, resisting elbow extension and abduction. The most flexion achieved in the right elbow was 75 degrees.     OT Diagnosis: Generalized weakness;Cognitive deficits;Altered mental status  OT Problem List: Decreased strength;Decreased range of motion;Decreased activity tolerance;Decreased cognition;Increased edema OT Treatment Interventions: Self-care/ADL  training;Therapeutic exercise;Cognitive remediation/compensation;Patient/family education   OT Goals(Current goals can be found in the care plan section) Acute Rehab OT Goals Patient Stated Goal: Unable to state ADL Goals Additional ADL Goal #1: Pt will show sustained arousal for 2 min during/prior to ADL Additional ADL Goal #2: Pt will follow one step commands 2/4 trials  Visit Information  Last OT Received On: 01/22/13 Assistance Needed: +2 History of Present Illness: Pt admitted with decr level of consciousness, UTI, sepsis, encephalopathy, and still with foot ulcers and sores on legs       Prior Functioning     Home Living Family/patient expects to be discharged to:: Skilled nursing facility Additional Comments: prior to admission to hospital in May lived alone and walked independently with cane or walker Prior Function Level of Independence: Needs assistance Gait / Transfers Assistance Needed: Walking well in therapy until about 2 weeks prior to admission Communication Communication: HOH Dominant Hand: Right            Cognition  Cognition Arousal/Alertness: Lethargic Behavior During Therapy: Flat affect Overall Cognitive Status: Difficult to assess Difficult to assess due to: Hard of hearing/deaf;Level of arousal    Extremity/Trunk Assessment Upper Extremity Assessment Upper Extremity Assessment: Difficult to assess due to impaired cognition              End of Session OT - End of Session Activity Tolerance: Patient limited by lethargy Patient left: in bed;with call bell/phone within reach;with bed alarm set;with family/visitor present Nurse Communication: Mobility status  GO     Sherryl Manges 01/22/2013, 2:18 PM

## 2013-01-22 NOTE — Clinical Social Work Psychosocial (Signed)
Clinical Social Work Department BRIEF PSYCHOSOCIAL ASSESSMENT 01/22/2013  Patient:  [name], [name]     Account Number:  [id number]     Admit date:  01/18/2013  Clinical Social Worker:  Read Drivers  Date/Time:  01/22/2013 02:33 PM  Referred by:  Physician  Date Referred:  01/22/2013 Referred for  SNF Placement   Other Referral:   none   Interview type:  Other - See comment Other interview type:   patient daughter, Lynden Ang    PSYCHOSOCIAL DATA Living Status:  FACILITY Admitted from facility:  [facility] Level of care:  Skilled Nursing Facility Primary support name:  cathy christopher Primary support relationship to patient:  CHILD, ADULT Degree of support available:   strong    CURRENT CONCERNS Current Concerns  Post-Acute Placement   Other Concerns:   none    SOCIAL WORK ASSESSMENT / PLAN CSW assessed pt at bedside.  Pt was unresponsive; daughter at bedside.  CSW introduced self and CSW role.    Daughter reports pt is from [facility], [place].  Pt daughter states that pt health is continuously declining even after being admitted to [facility].  Pt daughter states she would like to "take one day at time" in re: to SNF placement after d/c.  Pt daughter open to pt returning to [place] upon d/c.   Assessment/plan status:  Psychosocial Support/Ongoing Assessment of Needs Other assessment/ plan:   none   Information/referral to community resources:   SNF    PATIENT'S/FAMILY'S RESPONSE TO PLAN OF CARE: Pt daughter, Lynden Ang was very receptive to CSW education.  Pt daughter thanked CSW for time and assistance.     Vickii Penna, LCSWA [phone]  Clinical Social Work

## 2013-01-22 NOTE — Progress Notes (Signed)
I agree with the following treatment note after reviewing documentation.   Johnston, Tashiana Lamarca Brynn   OTR/L Pager: [phone] Office: [phone] .   

## 2013-01-22 NOTE — Progress Notes (Signed)
[facility] PROGRESS NOTE  [name]:[id number] DOB: 08/02/1923 DOA: 01/18/2013 PCP: Gretel Acre, MD  Interim summary:  79 y.o. 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 Patient was minimally responsive to noxious stimuli and as such history was obtained from the patient's daughter and ED physician.  Per the daughter, the patient was discharged from the hospital in the middle of May and sent to a skilled nursing facility. Over the 2 weeks prior to admission patient had been declining remarkably in terms of her cognition. One week prior to admission patient's daughter stated that patient was not lucid and had worsening mental status. Patient's daughter stated that she was called by the nursing home facility due to the mother being unresponsive with bradycardia and hypotension. EMS was called and patient was subsequently brought to the ED. In the ED the patient was also noted to be hypothermic. Her urinalysis was consistent with a possible UTI. Basic metabolic profile revealed an elevated BUN and creatinine but otherwise was within normal limits. POC troponin was negative. Lactic acid level was 1.0. Pro calcitonin level was 0.11. WBC was 13.9 and the hemoglobin was 9.2. Chest x-ray revealed left greater than right basilar opacities. Patient was given IV vancomycin, IV aztreonam, IV Flagyl, IV ciprofloxacin in the ER.  Patient's blood cultures grew out positive for multidrug resistant Pseudomonas. The concern was that the only drugs that were sensitive to penicillin which patient has an anaphylactic allergy to. Pharmacy is checking cultures and sensitivities to see if this Pseudomonas will respond to  aztreonam. Pharmacy we will to give final confirmation on Wednesday 7/9. If not, we'll start imipenem.  Patient lost IV access on 7/8.  plan is for PICC line to be placed. She is noted creatinine, but she's not felt to be a dialysis candidate.  Because of acute encephalopathy, patient is n.p.o. along with underlying dementia. We'll check swallow evaluation when she is more alert  Patient today was noted to have increased work of breathing. Previous x-rays noted some signs of early pulmonary edema. BNP checked and found to be elevated at almost 10,000, although in the setting of elevated creatinine 2.09. In addition, her INR has been steadily increasing and today's full 0.16 despite holding Coumadin. This is consistent with secondary hepatic congestion from volume overload. Have started IV Lasix. Echocardiogram already ordered is pending.   Assessment/Plan: Principal Problem:   Encephalopathy acute: Secondary to sepsis and UTI plus dehydration and volume overload. N.p.o. until more alert and able to check swallow eval  Active Problems:   Cellulitis: Unna boots have been removed and currently there are no signs of cellulitis present.  -There are numerous shallow ulcers likely secondary to ruptured blebs. There is a significant amount of drainage of clear liquid from her wounds.  -WOC RN following  -At this time her family would like the Unna boots not to be replaced  -wounds appear to be painful     Renal failure (ARF), acute on chronic: Staying around 2.    Hypotension: dehydration related in addition to sepsis  - cont to hydrate but since normotensive will decrease rate to 50/hr    History of pulmonary embolism: INR actually supratherapeutic    HTN (hypertension)   Anemia    Pure hypercholesterolemia  Hypothermia: Secondary to sepsis. Result.    UTI (lower urinary tract infection): Secondary to resistant Pseudomonas.    Dehydration    Bradycardia: -on Toprol pre admit but was  held due to hypotension and bradycardia  -had recurrent atrial fibrillation with a rapid rate after admit so IV Lopressor initiated    Sepsis: Secondary to Pseudomonas from UTI.    Cellulitis of leg: -Unna boots have been removed and currently there are no signs of cellulitis present.  -There are numerous shallow ulcers likely secondary to ruptured blebs. There is a significant amount of drainage of clear liquid from her wounds.  -WOC RN following  -At this time her family would like the Unna boots not to be replaced  -wounds appear to be painful     Supratherapeutic INR: Secondary to volume overload    Volume overload: Started IV Lasix, echo ordered   Code Status: DO NOT RESUSCITATE  Family Communication: Update his son at the bedside today.  Disposition Plan: Eventually will return back to skilled nursing facility   Consultants:  Critical care and  Wound care  Procedures:  2-D echocardiogram pending  Antibiotics:  IV vancomycin and Cipro: 7/4-7/8  Hoping to start IV aztreonam on 7/9  HPI/Subjective: Patient herself remains acutely encephalopathic. Does not respond to questions. Eyes" time she is awake.  Objective: Filed Vitals:   01/22/13 0215 01/22/13 0534 01/22/13 1017 01/22/13 1429  BP: 102/69 136/55 122/50 104/60  Pulse: 113 62 90 63  Temp: 97.3 F (36.3 C) 98.5 F (36.9 C) 97.3 F (36.3 C) 97.2 F (36.2 C)  TempSrc: Oral Axillary Axillary Axillary  Resp: 18 20 20 18   Height:      Weight:      SpO2: 97% 96% 91% 93%    Intake/Output Summary (Last 24 hours) at 01/22/13 1801 Last data filed at 01/22/13 1700  Gross per 24 hour  Intake      0 ml  Output   1100 ml  Net  -1100 ml   Filed Weights   01/20/13 0500 01/21/13 0314 01/21/13 0500  Weight: 116.3 kg (256 lb 6.3 oz) 115.3 kg (254 lb 3.1 oz) 115 kg (253 lb 8.5 oz)    Exam:   General:  Confused, somnolent, eyes ring closed  Cardiovascular: Regular rhythm, occasional ectopic beat, 2/6  systolic ejection murmur  Respiratory: Mostly upper airway noise, decreased breath sounds throughout  Abdomen: Soft, obese, nontender?, Hypoactive bowel sounds  Musculoskeletal: 3+ pitting edema   Data Reviewed: Basic Metabolic Panel:  Recent Labs Lab 01/18/13 1141 01/18/13 1938 01/19/13 0545 01/20/13 0423 01/22/13 0530  NA 141  --  144 147* 147*  K 5.0  --  5.7* 5.5* 4.9  CL 109  --  116* 120* 119*  CO2 21  --  16* 18* 17*  GLUCOSE 93  --  90 88 108*  BUN 68*  --  65* 68* 64*  CREATININE 1.99*  --  2.00* 2.09* 2.09*  CALCIUM 9.3  --  8.5 8.8 9.7  MG  --  1.7  --   --   --   PHOS  --  4.5  --   --   --    Liver Function Tests:  Recent Labs Lab 01/18/13 1141 01/19/13 0545 01/22/13 0530  AST 29 23 14   ALT 33 26 22  ALKPHOS 76 71 67  BILITOT 0.2* 0.2* 0.2*  PROT 6.0 5.4* 5.6*  ALBUMIN 2.7* 2.3* 2.3*   CBC:  Recent Labs Lab 01/18/13  1141 01/19/13 0545 01/20/13 0423 01/22/13 0530  WBC 13.9* 12.4* 9.3 8.5  NEUTROABS 12.7*  --   --   --   HGB 9.2* 8.4* 8.3* 8.6*  HCT 27.4* 25.7* 25.3* 27.5*  MCV 94.8 95.2 95.5 96.5  PLT 173 163 167 167   BNP (last 3 results)  Recent Labs  01/22/13 0530  PROBNP 9436.0*   CBG:  Recent Labs Lab 01/18/13 1202  GLUCAP 99    Recent Results (from the past 240 hour(s))  URINE CULTURE     Status: None   Collection Time    01/18/13 11:48 AM      Result Value Range Status   Specimen Description URINE, RANDOM   Final   Special Requests NONE   Final   Culture  Setup Time 01/18/2013 19:13   Final   Colony Count >=100,000 COLONIES/ML   Final   Culture     Final   Value: Multiple bacterial morphotypes present, none predominant. Suggest appropriate recollection if clinically indicated.   Report Status 01/20/2013 FINAL   Final  CULTURE, BLOOD (ROUTINE X 2)     Status: None   Collection Time    01/18/13 12:15 PM      Result Value Range Status   Specimen Description BLOOD ARM LEFT   Final   Special Requests BOTTLES DRAWN  AEROBIC AND ANAEROBIC 10CC   Final   Culture  Setup Time 01/18/2013 18:48   Final   Culture     Final   Value: PSEUDOMONAS AERUGINOSA     Note: Gram Stain Report Called to,Read Back By and Verified With: WILLIAM RICE 01/19/13 @ 9:23PM BY RUSCOE A.   Report Status PENDING   Incomplete   Organism ID, Bacteria PSEUDOMONAS AERUGINOSA   Final  CULTURE, BLOOD (ROUTINE X 2)     Status: None   Collection Time    01/18/13 12:20 PM      Result Value Range Status   Specimen Description BLOOD ARM RIGHT   Final   Special Requests BOTTLES DRAWN AEROBIC ONLY 5CC   Final   Culture  Setup Time 01/18/2013 18:48   Final   Culture     Final   Value: STAPHYLOCOCCUS SPECIES (COAGULASE NEGATIVE)     Note: THE SIGNIFICANCE OF ISOLATING THIS ORGANISM FROM A SINGLE SET OF BLOOD CULTURES WHEN MULTIPLE SETS ARE DRAWN IS UNCERTAIN. PLEASE NOTIFY THE MICROBIOLOGY DEPARTMENT WITHIN ONE WEEK IF SPECIATION AND SENSITIVITIES ARE REQUIRED.     Note: Gram Stain Report Called to,Read Back By and Verified With: Asencion Gowda RN on 01/20/13 at 00:55 by Christie Nottingham   Report Status 01/21/2013 FINAL   Final  MRSA PCR SCREENING     Status: None   Collection Time    01/18/13  4:24 PM      Result Value Range Status   MRSA by PCR NEGATIVE  NEGATIVE Final   Comment:            The GeneXpert MRSA Assay (FDA     approved for NASAL specimens     only), is one component of a     comprehensive MRSA colonization     surveillance program. It is not     intended to diagnose MRSA     infection nor to guide or     monitor treatment for     MRSA infections.     Studies: No results found.  Scheduled Meds: . aztreonam  1 g Intravenous Q8H  . furosemide  20  mg Intravenous BID  . metoprolol  5 mg Intravenous Q6H  . neomycin-bacitracin-polymyxin   Topical BID  . pantoprazole (PROTONIX) IV  40 mg Intravenous Q24H  . sodium chloride  3 mL Intravenous Q12H   Continuous Infusions:   Principal Problem:   Encephalopathy acute Active  Problems:   Cellulitis   Renal failure (ARF), acute on chronic   Hypotension   History of pulmonary embolism   HTN (hypertension)   Anemia   Pure hypercholesterolemia   Chronic kidney disease, unspecified   Hypothermia   UTI (lower urinary tract infection)   Dehydration   Bradycardia   Solitary kidney   Sepsis   Cellulitis of leg   Supratherapeutic INR   Volume overload    Time spent: 35 minutes    Hollice Espy  [facility] Pager [phone]. If 7PM-7AM, please contact night-coverage at [email or web address], password [facility] 01/22/2013, 6:01 PM  LOS: 4 days

## 2013-01-23 ENCOUNTER — Inpatient Hospital Stay (HOSPITAL_COMMUNITY): Payer: Medicare Other

## 2013-01-23 DIAGNOSIS — N189 Chronic kidney disease, unspecified: Secondary | ICD-10-CM

## 2013-01-23 LAB — PROTIME-INR
INR: 4.87 — ABNORMAL HIGH (ref 0.00–1.49)
Prothrombin Time: 43.5 seconds — ABNORMAL HIGH (ref 11.6–15.2)

## 2013-01-23 MED ORDER — SULFAMETHOXAZOLE-TMP DS 800-160 MG PO TABS: 1.0000 | ORAL_TABLET | Freq: Two times a day (BID) | ORAL | Status: DC

## 2013-01-23 NOTE — Procedures (Signed)
PICC was successfully exchanged and positioned at SVC/RA junction.

## 2013-01-23 NOTE — Progress Notes (Signed)
SLP Cancellation Note  Patient Details Name: [name] MRN: [id number] DOB: 1924-05-04   Cancelled treatment:       Reason Eval/Treat Not Completed: Pain limiting ability to participate;Fatigue/lethargy limiting ability to participate. Daughter at bedside reports pt is still delirious and is in too much pain to be disturbed. SLP will re-attempt PO trials tomorrow.   Harlon Ditty, [place] CCC-SLP [phone]  [name] 01/23/2013, 10:46 AM

## 2013-01-23 NOTE — Progress Notes (Signed)
Consent form signed by pt daughter for pt to have picc line exchanged. Pt daughter reports she is fully aware of procedure and has been previously provided with information. IR has been notified and says they will be up shortly to pick pt up.

## 2013-01-23 NOTE — Progress Notes (Signed)
[facility] PROGRESS NOTE  [name] XBJ:[id number] DOB: 03/06/1924 DOA: 01/18/2013 PCP: Gretel Acre, MD  Assessment/Plan: Encephalopathy acute:  -Secondary to sepsis and UTI plus dehydration and volume overload.  -N.p.o. until more alert and able to check swallow eval  - Currently on aztreonam - awaiting sensitivities with possible change to imipenem - Discussed with pharmacy  Cellulitis:  -Unna boots removed with no signs of cellulitis -WOC RN following  -At this time her family would like the Unna boots not to be replaced    Renal failure (ARF), acute on chronic:  -Cr Staying around 2.   Hypotension: -  dehydration related in addition to sepsis  - cont to hydrate  History of pulmonary embolism:  - INR supratherapeutic  - Pharmacy following  HTN (hypertension): -Stable and controlled  Anemia  -hgb stable  Pure hypercholesterolemia   Hypothermia: - Secondary to sepsis.  UTI (lower urinary tract infection):  - Secondary to resistant Pseudomonas.   Bradycardia:  -on Toprol pre admit but was held due to hypotension and bradycardia  -had recurrent atrial fibrillation with a rapid rate after admit so IV Lopressor initiated   Sepsis: - Secondary to Pseudomonas from UTI.   Volume overload:  -Started IV Lasix - Echo with EF 50-55%  Code Status: DNR Family Communication: Pt's grand-daughter and her husband in room (indicate person spoken with, relationship, and if by phone, the number) Disposition Plan: Pending   Consultants: Critical care Wound care  Procedures: 2-D echocardiogram pending  Antibiotics: IV vancomycin and Cipro: 7/4-7/8  IV aztreonam on 7/5>>>  HPI/Subjective: No acute events overnight  Objective: Filed Vitals:   01/23/13 0114 01/23/13 0500 01/23/13 0508 01/23/13 0958  BP: 122/56  127/92   Pulse: 55  71 71  Temp: 97.4 F (36.3 C)  97.4 F (36.3 C) 96.3 F (35.7 C)  TempSrc: Axillary  Axillary Axillary  Resp: 18  18  20   Height:      Weight:  111.9 kg (246 lb 11.1 oz)    SpO2: 98%  100% 100%    Intake/Output Summary (Last 24 hours) at 01/23/13 1253 Last data filed at 01/23/13 0636  Gross per 24 hour  Intake      0 ml  Output   2100 ml  Net  -2100 ml   Filed Weights   01/21/13 0314 01/21/13 0500 01/23/13 0500  Weight: 115.3 kg (254 lb 3.1 oz) 115 kg (253 lb 8.5 oz) 111.9 kg (246 lb 11.1 oz)    Exam:   General:  Lethargic, grimaces on light touch  Cardiovascular: regular, s1, s2  Respiratory: normal resp effort, no wheezing  Abdomen: soft, nondistended  Musculoskeletal: perfused, no clubbing   Data Reviewed: Basic Metabolic Panel:  Recent Labs Lab 01/18/13 1141 01/18/13 1938 01/19/13 0545 01/20/13 0423 01/22/13 0530  NA 141  --  144 147* 147*  K 5.0  --  5.7* 5.5* 4.9  CL 109  --  116* 120* 119*  CO2 21  --  16* 18* 17*  GLUCOSE 93  --  90 88 108*  BUN 68*  --  65* 68* 64*  CREATININE 1.99*  --  2.00* 2.09* 2.09*  CALCIUM 9.3  --  8.5 8.8 9.7  MG  --  1.7  --   --   --   PHOS  --  4.5  --   --   --    Liver Function Tests:  Recent Labs Lab 01/18/13 1141 01/19/13 0545 01/22/13 0530  AST  29 23 14   ALT 33 26 22  ALKPHOS 76 71 67  BILITOT 0.2* 0.2* 0.2*  PROT 6.0 5.4* 5.6*  ALBUMIN 2.7* 2.3* 2.3*   No results found for this basename: LIPASE, AMYLASE,  in the last 168 hours No results found for this basename: AMMONIA,  in the last 168 hours CBC:  Recent Labs Lab 01/18/13 1141 01/19/13 0545 01/20/13 0423 01/22/13 0530  WBC 13.9* 12.4* 9.3 8.5  NEUTROABS 12.7*  --   --   --   HGB 9.2* 8.4* 8.3* 8.6*  HCT 27.4* 25.7* 25.3* 27.5*  MCV 94.8 95.2 95.5 96.5  PLT 173 163 167 167   Cardiac Enzymes: No results found for this basename: CKTOTAL, CKMB, CKMBINDEX, TROPONINI,  in the last 168 hours BNP (last 3 results)  Recent Labs  01/22/13 0530  PROBNP 9436.0*   CBG:  Recent Labs Lab 01/18/13 1202  GLUCAP 99    Recent Results (from the past 240  hour(s))  URINE CULTURE     Status: None   Collection Time    01/18/13 11:48 AM      Result Value Range Status   Specimen Description URINE, RANDOM   Final   Special Requests NONE   Final   Culture  Setup Time 01/18/2013 19:13   Final   Colony Count >=100,000 COLONIES/ML   Final   Culture     Final   Value: Multiple bacterial morphotypes present, none predominant. Suggest appropriate recollection if clinically indicated.   Report Status 01/20/2013 FINAL   Final  CULTURE, BLOOD (ROUTINE X 2)     Status: None   Collection Time    01/18/13 12:15 PM      Result Value Range Status   Specimen Description BLOOD ARM LEFT   Final   Special Requests BOTTLES DRAWN AEROBIC AND ANAEROBIC 10CC   Final   Culture  Setup Time 01/18/2013 18:48   Final   Culture     Final   Value: PSEUDOMONAS AERUGINOSA     Note: AZTREONAM RESULTS TO FOLLOW     Note: Gram Stain Report Called to,Read Back By and Verified With: WILLIAM RICE 01/19/13 @ 9:23PM BY RUSCOE A.   Report Status PENDING   Incomplete   Organism ID, Bacteria PSEUDOMONAS AERUGINOSA   Final  CULTURE, BLOOD (ROUTINE X 2)     Status: None   Collection Time    01/18/13 12:20 PM      Result Value Range Status   Specimen Description BLOOD ARM RIGHT   Final   Special Requests BOTTLES DRAWN AEROBIC ONLY 5CC   Final   Culture  Setup Time 01/18/2013 18:48   Final   Culture     Final   Value: STAPHYLOCOCCUS SPECIES (COAGULASE NEGATIVE)     Note: THE SIGNIFICANCE OF ISOLATING THIS ORGANISM FROM A SINGLE SET OF BLOOD CULTURES WHEN MULTIPLE SETS ARE DRAWN IS UNCERTAIN. PLEASE NOTIFY THE MICROBIOLOGY DEPARTMENT WITHIN ONE WEEK IF SPECIATION AND SENSITIVITIES ARE REQUIRED.     Note: Gram Stain Report Called to,Read Back By and Verified With: Asencion Gowda RN on 01/20/13 at 00:55 by Christie Nottingham   Report Status 01/21/2013 FINAL   Final  MRSA PCR SCREENING     Status: None   Collection Time    01/18/13  4:24 PM      Result Value Range Status   MRSA by PCR NEGATIVE   NEGATIVE Final   Comment:            The  GeneXpert MRSA Assay (FDA     approved for NASAL specimens     only), is one component of a     comprehensive MRSA colonization     surveillance program. It is not     intended to diagnose MRSA     infection nor to guide or     monitor treatment for     MRSA infections.     Studies: Dg Chest Port 1 View  01/22/2013   *RADIOLOGY REPORT*  Clinical Data: Confirm line placement.  PORTABLE CHEST - 1 VIEW  Comparison: 01/18/2013  Findings: Right central venous catheter which appears to be a PICC line is coiled in the axillary region.  The tip is projected over the distal subclavian vein but location in the vein cannot be confirmed due to the course of the catheter. No pneumothorax. Cardiac enlargement with normal pulmonary vascularity. Interstitial fibrosis versus atelectasis in the lung bases. Calcified and tortuous aorta.  No blunting of costophrenic angles.  IMPRESSION: Right PICC line is coiled over the right axillary region.  Tip location cannot be confirmed.  Results were telephoned to [facility] on the IV team at 2203 hours on 01/22/2013.   Original Report Authenticated By: Burman Nieves, M.D.    Scheduled Meds: . aztreonam  1 g Intravenous Q8H  . furosemide  20 mg Intravenous BID  . metoprolol  5 mg Intravenous Q6H  . neomycin-bacitracin-polymyxin   Topical BID  . pantoprazole (PROTONIX) IV  40 mg Intravenous Q24H  . sodium chloride  3 mL Intravenous Q12H   Continuous Infusions:   Principal Problem:   Encephalopathy acute Active Problems:   Cellulitis   Renal failure (ARF), acute on chronic   Hypotension   History of pulmonary embolism   HTN (hypertension)   Anemia   Pure hypercholesterolemia   Chronic kidney disease, unspecified   Hypothermia   UTI (lower urinary tract infection)   Dehydration   Bradycardia   Solitary kidney   Sepsis   Cellulitis of leg   Supratherapeutic INR   Volume overload    Time spent:     CHIU, STEPHEN K  [facility] Pager [phone]. If 7PM-7AM, please contact night-coverage at [email or web address], password [facility] 01/23/2013, 12:53 PM  LOS: 5 days

## 2013-01-24 ENCOUNTER — Inpatient Hospital Stay (HOSPITAL_COMMUNITY): Payer: Medicare Other

## 2013-01-24 LAB — CULTURE, BLOOD (ROUTINE X 2)

## 2013-01-24 NOTE — Progress Notes (Signed)
ANTIBIOTIC CONSULT NOTE - FOLLOW UP  Pharmacy Consult for Aztreonam Indication: Pseudomonas Bacteremia  Allergies  Allergen Reactions  . Penicillins Anaphylaxis  . Codeine     Verified with daughter- N/V "violently ill"    Patient Measurements: Height: 5\' 2"  (157.5 cm) Weight: 244 lb 14.9 oz (111.1 kg) IBW/kg (Calculated) : 50.1  Vital Signs: BP: 97/79 mmHg (07/10 0646) Pulse Rate: 121 (07/10 0646) Intake/Output from previous day: 07/09 0701 - 07/10 0700 In: -  Out: 1200 [Urine:1200] Intake/Output from this shift:    Labs:  Recent Labs  01/22/13 0530  WBC 8.5  HGB 8.6*  PLT 167  CREATININE 2.09*   Estimated Creatinine Clearance: 22.3 ml/min (by C-G formula based on Cr of 2.09). No results found for this basename: VANCOTROUGH, Leodis Binet, VANCORANDOM, GENTTROUGH, GENTPEAK, GENTRANDOM, TOBRATROUGH, TOBRAPEAK, TOBRARND, AMIKACINPEAK, AMIKACINTROU, AMIKACIN,  in the last 72 hours   Microbiology: Recent Results (from the past 720 hour(s))  URINE CULTURE     Status: None   Collection Time    01/18/13 11:48 AM      Result Value Range Status   Specimen Description URINE, RANDOM   Final   Special Requests NONE   Final   Culture  Setup Time 01/18/2013 19:13   Final   Colony Count >=100,000 COLONIES/ML   Final   Culture     Final   Value: Multiple bacterial morphotypes present, none predominant. Suggest appropriate recollection if clinically indicated.   Report Status 01/20/2013 FINAL   Final  CULTURE, BLOOD (ROUTINE X 2)     Status: None   Collection Time    01/18/13 12:15 PM      Result Value Range Status   Specimen Description BLOOD ARM LEFT   Final   Special Requests BOTTLES DRAWN AEROBIC AND ANAEROBIC 10CC   Final   Culture  Setup Time 01/18/2013 18:48   Final   Culture     Final   Value: PSEUDOMONAS AERUGINOSA     Note: AZTREONAM SENSITIVE 2ug/mL     Note: Gram Stain Report Called to,Read Back By and Verified With: WILLIAM RICE 01/19/13 @ 9:23PM BY RUSCOE A.    Report Status 01/24/2013 FINAL   Final   Organism ID, Bacteria PSEUDOMONAS AERUGINOSA   Final  CULTURE, BLOOD (ROUTINE X 2)     Status: None   Collection Time    01/18/13 12:20 PM      Result Value Range Status   Specimen Description BLOOD ARM RIGHT   Final   Special Requests BOTTLES DRAWN AEROBIC ONLY 5CC   Final   Culture  Setup Time 01/18/2013 18:48   Final   Culture     Final   Value: STAPHYLOCOCCUS SPECIES (COAGULASE NEGATIVE)     Note: THE SIGNIFICANCE OF ISOLATING THIS ORGANISM FROM A SINGLE SET OF BLOOD CULTURES WHEN MULTIPLE SETS ARE DRAWN IS UNCERTAIN. PLEASE NOTIFY THE MICROBIOLOGY DEPARTMENT WITHIN ONE WEEK IF SPECIATION AND SENSITIVITIES ARE REQUIRED.     Note: Gram Stain Report Called to,Read Back By and Verified With: Asencion Gowda RN on 01/20/13 at 00:55 by Christie Nottingham   Report Status 01/21/2013 FINAL   Final  MRSA PCR SCREENING     Status: None   Collection Time    01/18/13  4:24 PM      Result Value Range Status   MRSA by PCR NEGATIVE  NEGATIVE Final   Comment:            The GeneXpert MRSA Assay (FDA  approved for NASAL specimens     only), is one component of a     comprehensive MRSA colonization     surveillance program. It is not     intended to diagnose MRSA     infection nor to guide or     monitor treatment for     MRSA infections.    Anti-infectives   Start     Dose/Rate Route Frequency Ordered Stop   01/23/13 1130  sulfamethoxazole-trimethoprim (BACTRIM DS) 800-160 MG per tablet 1 tablet  Status:  Discontinued     1 tablet Oral Every 12 hours 01/23/13 1125 01/23/13 1128   01/21/13 1600  imipenem-cilastatin (PRIMAXIN) 250 mg in sodium chloride 0.9 % 100 mL IVPB  Status:  Discontinued     250 mg 200 mL/hr over 30 Minutes Intravenous Every 6 hours 01/21/13 1447 01/21/13 1618   01/20/13 1600  vancomycin (VANCOCIN) 1,500 mg in sodium chloride 0.9 % 500 mL IVPB  Status:  Discontinued     1,500 mg 250 mL/hr over 120 Minutes Intravenous Every 48 hours  01/18/13 1446 01/21/13 1407   01/19/13 2200  aztreonam (AZACTAM) 1 g in dextrose 5 % 50 mL IVPB     1 g 100 mL/hr over 30 Minutes Intravenous 3 times per day 01/19/13 2136     01/19/13 1400  ciprofloxacin (CIPRO) IVPB 400 mg  Status:  Discontinued     400 mg 200 mL/hr over 60 Minutes Intravenous Every 24 hours 01/18/13 1446 01/21/13 1443   01/18/13 2200  metroNIDAZOLE (FLAGYL) IVPB 500 mg  Status:  Discontinued     500 mg 100 mL/hr over 60 Minutes Intravenous Every 8 hours 01/18/13 1300 01/18/13 1635   01/18/13 2200  aztreonam (AZACTAM) 1 g in dextrose 5 % 50 mL IVPB  Status:  Discontinued     1 g 100 mL/hr over 30 Minutes Intravenous 3 times per day 01/18/13 1446 01/18/13 1635   01/18/13 1400  vancomycin (VANCOCIN) 2,000 mg in sodium chloride 0.9 % 500 mL IVPB     2,000 mg 250 mL/hr over 120 Minutes Intravenous To Emergency Dept 01/18/13 1300 01/18/13 1712   01/18/13 1400  ciprofloxacin (CIPRO) IVPB 400 mg  Status:  Discontinued     400 mg 200 mL/hr over 60 Minutes Intravenous  Once 01/18/13 1358 01/21/13 1443   01/18/13 1145  aztreonam (AZACTAM) 2 g in dextrose 5 % 50 mL IVPB     2 g 100 mL/hr over 30 Minutes Intravenous  Once 01/18/13 1141 01/18/13 1356   01/18/13 1145  metroNIDAZOLE (FLAGYL) IVPB 500 mg     500 mg 100 mL/hr over 60 Minutes Intravenous  Once 01/18/13 1141 01/18/13 1512   01/18/13 1145  vancomycin (VANCOCIN) IVPB 1000 mg/200 mL premix  Status:  Discontinued     1,000 mg 200 mL/hr over 60 Minutes Intravenous  Once 01/18/13 1141 01/18/13 1258      Assessment: 79 year old female admitted with UTI and sepsis, now found to have Pseudomonas bacteremia.  She is currently being treated with Aztreonam.  A manual E-test is being done and results will be available soon.    Plan:  Continue Aztreonam 1gm IV q8h Monitor renal function  Await further susceptibility data  Thank you Piedad Climes, PharmD Pager [phone]  01/24/2013, 8:50 AM

## 2013-01-24 NOTE — Progress Notes (Signed)
Physical Therapy Treatment Patient Details Name: [name] MRN: [id number] DOB: 11/09/1923 Today's Date: 01/24/2013 Time: 4098-1191 PT Time Calculation (min): 25 min  PT Assessment / Plan / Recommendation  PT Comments   Will continue to trial period.  Pt did participate minimally, but was not able to participate therapeutically, eyes mostly closed, mumbling words that were generally unintelligible and not able to follow commands to transition through activities.  Follow Up Recommendations  SNF     Does the patient have the potential to tolerate intense rehabilitation     Barriers to Discharge        Equipment Recommendations  Rolling walker with 5" wheels;3in1 (PT);Other (comment) (TBA whether pt will need above equipment PT D/C)    Recommendations for Other Services    Frequency     Progress towards PT Goals Progress towards PT goals: Not progressing toward goals - comment (pt not aware enough of her surroundings to participate well.)  Plan Current plan remains appropriate    Precautions / Restrictions Precautions Precautions: Fall Precaution Comments: legs have open sores and are weepy.   Pertinent Vitals/Pain     Mobility  Bed Mobility Bed Mobility: Supine to Sit;Sitting - Scoot to Edge of Bed;Sit to Supine Supine to Sit: 1: +1 Total assist Supine to Sit: Patient Percentage: 0% Sitting - Scoot to Edge of Bed: 1: +1 Total assist Sitting - Scoot to Edge of Bed: Patient Percentage: 0% Sit to Supine: 1: +2 Total assist Sit to Supine: Patient Percentage: 10% Details for Bed Mobility Assistance: pt did not assist to sit up, but initiated trying to assist when laying back down Transfers Transfers: Sit to Stand;Stand to Sit Sit to Stand: 1: +1 Total assist;From bed Stand to Sit: 1: +1 Total assist;To bed Details for Transfer Assistance: pt did not initiate stand with verbal cues, but did assist stand once assist applied Ambulation/Gait Ambulation/Gait Assistance:  Not tested (comment) Stairs: No Wheelchair Mobility Wheelchair Mobility: No    Exercises General Exercises - Lower Extremity Heel Slides: AAROM;PROM;Both;10 reps;Supine;Other (comment) (for ROM and to decr stiffness)   PT Diagnosis:    PT Problem List:   PT Treatment Interventions:     PT Goals (current goals can now be found in the care plan section) Acute Rehab PT Goals Patient Stated Goal: Unable to state PT Goal Formulation: Patient unable to participate in goal setting Time For Goal Achievement: 02/03/13 Potential to Achieve Goals: Fair  Visit Information  Last PT Received On: 01/24/13 Assistance Needed: +2 (safest) History of Present Illness: Pt admitted with decr level of consciousness, UTI, sepsis, encephalopathy, and still with foot ulcers and sores on legs    Subjective Data  Subjective: pt appeared to be mumbling unintelligibly, but was replying softly , but purposefully on occaision Patient Stated Goal: Unable to state   Cognition  Cognition Arousal/Alertness: Lethargic Behavior During Therapy: Flat affect Overall Cognitive Status: Difficult to assess Area of Impairment: Attention Current Attention Level: Sustained Following Commands: Follows one step commands inconsistently;Follows one step commands with increased time    Balance  Static Sitting Balance Static Sitting - Balance Support: Feet supported;Right upper extremity supported;Left upper extremity supported Static Sitting - Level of Assistance: Other (comment);5: Stand by assistance (min guard) Static Sitting - Comment/# of Minutes: sitting EOB x 20 min while trying to ellicit responses from the pt and while getting her bed cleaned up.  End of Session PT - End of Session Equipment Utilized During Treatment: Other (comment) Activity Tolerance: Patient  limited by lethargy Patient left: in bed;with call bell/phone within reach Nurse Communication: Mobility status   GP     Iyad Deroo, Eliseo Gum 01/24/2013, 3:43 PM

## 2013-01-24 NOTE — Progress Notes (Signed)
[facility] PROGRESS NOTE  [name] ZOX:[id number] DOB: August 06, 1923 DOA: 01/18/2013 PCP: Gretel Acre, MD  Assessment/Plan: Encephalopathy acute:  -Secondary to sepsis and UTI plus dehydration and volume overload.  -N.p.o. until more alert and able to check swallow eval  - Currently on aztreonam -urine is sensitive to current abx. Cont abx Cellulitis:  -Unna boots removed with no signs of cellulitis  -WOC RN following  -At this time her family would like the Unna boots not to be replaced  Renal failure (ARF), acute on chronic:  -Cr Staying around 2.  Hypotension:  - dehydration related in addition to sepsis  - cont to hydrate  History of pulmonary embolism:   - Pharmacy following  HTN (hypertension):  -Stable and controlled  Anemia  -hgb stable  Pure hypercholesterolemia  Hypothermia:  - Secondary to sepsis.  UTI (lower urinary tract infection):  - Secondary to resistant Pseudomonas.  Bradycardia:  -on Toprol pre admit but was held due to hypotension and bradycardia  -had recurrent atrial fibrillation with a rapid rate after admit so IV Lopressor initiated  Sepsis:  - Secondary to Pseudomonas from UTI.  Volume overload:  -Started IV Lasix  - Echo with EF 50-55% FEN: -Discussed with family who does not believe patient would agree w/ feeding tube  Code Status: DNR Family Communication: Pt in room (indicate person spoken with, relationship, and if by phone, the number) Disposition Plan: Pending   Consultants: Critical care  Wound care  Procedures: 2-D echocardiogram pending  Antibiotics: IV vancomycin and Cipro: 7/4-7/8  IV aztreonam on 7/5>>>  HPI/Subjective: No acute events.  Objective: Filed Vitals:   01/24/13 0452 01/24/13 0646 01/24/13 1000 01/24/13 1430  BP:  97/79 77/53 122/87  Pulse:  121 60 81  Temp:   96.2 F (35.7 C) 97.3 F (36.3 C)  TempSrc:   Axillary Axillary  Resp:  22 18 19   Height:      Weight: 111.1 kg (244 lb 14.9 oz)      SpO2:   100% 97%    Intake/Output Summary (Last 24 hours) at 01/24/13 1622 Last data filed at 01/24/13 0700  Gross per 24 hour  Intake      0 ml  Output   1200 ml  Net  -1200 ml   Filed Weights   01/21/13 0500 01/23/13 0500 01/24/13 0452  Weight: 115 kg (253 lb 8.5 oz) 111.9 kg (246 lb 11.1 oz) 111.1 kg (244 lb 14.9 oz)    Exam:   General:  Lethargic, in nad  Cardiovascular: regular, s1, s2  Respiratory: mildly increased resp effort, no wheezing  Abdomen: soft, nondistended  Musculoskeletal: perfused, no clubbing   Data Reviewed: Basic Metabolic Panel:  Recent Labs Lab 01/18/13 1141 01/18/13 1938 01/19/13 0545 01/20/13 0423 01/22/13 0530  NA 141  --  144 147* 147*  K 5.0  --  5.7* 5.5* 4.9  CL 109  --  116* 120* 119*  CO2 21  --  16* 18* 17*  GLUCOSE 93  --  90 88 108*  BUN 68*  --  65* 68* 64*  CREATININE 1.99*  --  2.00* 2.09* 2.09*  CALCIUM 9.3  --  8.5 8.8 9.7  MG  --  1.7  --   --   --   PHOS  --  4.5  --   --   --    Liver Function Tests:  Recent Labs Lab 01/18/13 1141 01/19/13 0545 01/22/13 0530  AST 29 23 14  ALT 33 26 22  ALKPHOS 76 71 67  BILITOT 0.2* 0.2* 0.2*  PROT 6.0 5.4* 5.6*  ALBUMIN 2.7* 2.3* 2.3*   No results found for this basename: LIPASE, AMYLASE,  in the last 168 hours No results found for this basename: AMMONIA,  in the last 168 hours CBC:  Recent Labs Lab 01/18/13 1141 01/19/13 0545 01/20/13 0423 01/22/13 0530  WBC 13.9* 12.4* 9.3 8.5  NEUTROABS 12.7*  --   --   --   HGB 9.2* 8.4* 8.3* 8.6*  HCT 27.4* 25.7* 25.3* 27.5*  MCV 94.8 95.2 95.5 96.5  PLT 173 163 167 167   Cardiac Enzymes: No results found for this basename: CKTOTAL, CKMB, CKMBINDEX, TROPONINI,  in the last 168 hours BNP (last 3 results)  Recent Labs  01/22/13 0530  PROBNP 9436.0*   CBG:  Recent Labs Lab 01/18/13 1202  GLUCAP 99    Recent Results (from the past 240 hour(s))  URINE CULTURE     Status: None   Collection Time     01/18/13 11:48 AM      Result Value Range Status   Specimen Description URINE, RANDOM   Final   Special Requests NONE   Final   Culture  Setup Time 01/18/2013 19:13   Final   Colony Count >=100,000 COLONIES/ML   Final   Culture     Final   Value: Multiple bacterial morphotypes present, none predominant. Suggest appropriate recollection if clinically indicated.   Report Status 01/20/2013 FINAL   Final  CULTURE, BLOOD (ROUTINE X 2)     Status: None   Collection Time    01/18/13 12:15 PM      Result Value Range Status   Specimen Description BLOOD ARM LEFT   Final   Special Requests BOTTLES DRAWN AEROBIC AND ANAEROBIC 10CC   Final   Culture  Setup Time 01/18/2013 18:48   Final   Culture     Final   Value: PSEUDOMONAS AERUGINOSA     Note: AZTREONAM SENSITIVE 2ug/mL     Note: Gram Stain Report Called to,Read Back By and Verified With: WILLIAM RICE 01/19/13 @ 9:23PM BY RUSCOE A.   Report Status 01/24/2013 FINAL   Final   Organism ID, Bacteria PSEUDOMONAS AERUGINOSA   Final  CULTURE, BLOOD (ROUTINE X 2)     Status: None   Collection Time    01/18/13 12:20 PM      Result Value Range Status   Specimen Description BLOOD ARM RIGHT   Final   Special Requests BOTTLES DRAWN AEROBIC ONLY 5CC   Final   Culture  Setup Time 01/18/2013 18:48   Final   Culture     Final   Value: STAPHYLOCOCCUS SPECIES (COAGULASE NEGATIVE)     Note: THE SIGNIFICANCE OF ISOLATING THIS ORGANISM FROM A SINGLE SET OF BLOOD CULTURES WHEN MULTIPLE SETS ARE DRAWN IS UNCERTAIN. PLEASE NOTIFY THE MICROBIOLOGY DEPARTMENT WITHIN ONE WEEK IF SPECIATION AND SENSITIVITIES ARE REQUIRED.     Note: Gram Stain Report Called to,Read Back By and Verified With: Asencion Gowda RN on 01/20/13 at 00:55 by Christie Nottingham   Report Status 01/21/2013 FINAL   Final  MRSA PCR SCREENING     Status: None   Collection Time    01/18/13  4:24 PM      Result Value Range Status   MRSA by PCR NEGATIVE  NEGATIVE Final   Comment:            The GeneXpert MRSA  Assay (FDA  approved for NASAL specimens     only), is one component of a     comprehensive MRSA colonization     surveillance program. It is not     intended to diagnose MRSA     infection nor to guide or     monitor treatment for     MRSA infections.     Studies: Ir Fluoro Guide Cv Line Right  01/23/2013   *RADIOLOGY REPORT*  Clinical history:Malpositioned right arm PICC line.  PROCEDURE(S): EXCHANGE OF RIGHT ARM PICC LINE WITH FLUOROSCOPY  Physician: Rachelle Hora. Henn, MD  Medications:None  Moderate sedation time:None  Fluoroscopy time: 2 minutes  Procedure:Informed consent was obtained for a PICC line exchange. The right upper arm was prepped and draped in a sterile fashion. Maximal barrier sterile technique was utilized including caps, mask, sterile gowns, sterile gloves, sterile drape, hand hygiene and skin antiseptic.  The catheter was cut and removed over a wire. The wire took a very tortuous course in the shoulder.  Eventually, a 5-French Kumpe catheter and glidewire were advanced into the SVC. A single lumen Power PICC line was cut to 44 cm and advanced over the Glidewire.  Catheter was placed at the junction of superior vena cava and right atrium.  Catheter flushed and aspirated well. Skin was anesthetized with lidocaine.  Catheter sutured to the skin.  Findings:The old catheter was coiled in the right subclavianvein region.  New catheter tip at the SVC/RA junction.  Complications: None  Impression:Successful exchange of the right arm PICC line.   Original Report Authenticated By: Richarda Overlie, M.D.   Dg Chest Port 1 View  01/23/2013   *RADIOLOGY REPORT*  Clinical Data: The PICC line placement.  PORTABLE CHEST - 1 VIEW  Comparison: 01/23/2013  Findings: Right PICC line is in place with the tip in the SVC. Mild cardiomegaly.  Bibasilar atelectasis and vascular congestion. Decreasing lung volumes since prior study.  IMPRESSION: Right PICC line pulled back with the tip now in the SVC.  Shallow  inspiration.  Vascular congestion and bibasilar atelectasis.   Original Report Authenticated By: Charlett Nose, M.D.   Dg Chest Port 1 View  01/23/2013   *RADIOLOGY REPORT*  Clinical Data: PICC line placement  PORTABLE CHEST - 1 VIEW  Comparison: Portable exam 2054 hours compared to 01/22/2013  Findings: Tip of right arm PICC line projects over right atrium. Enlargement of cardiac silhouette with pulmonary vascular congestion. Calcified tortuous aorta. Bibasilar atelectasis. Bronchitic and question emphysematous changes. Question mild infiltrate right upper lobe. No gross pleural effusion or pneumothorax.  IMPRESSION: Tip of right arm PICC line projects over right atrium; recommend withdrawal 5 cm for positioning at cavoatrial junction. Enlargement of cardiac silhouette with pulmonary vascular congestion. Bronchitic question emphysematous changes with bibasilar atelectasis and questionable right upper lobe infiltrate.   Original Report Authenticated By: Ulyses Southward, M.D.   Dg Chest Port 1 View  01/22/2013   *RADIOLOGY REPORT*  Clinical Data: Confirm line placement.  PORTABLE CHEST - 1 VIEW  Comparison: 01/18/2013  Findings: Right central venous catheter which appears to be a PICC line is coiled in the axillary region.  The tip is projected over the distal subclavian vein but location in the vein cannot be confirmed due to the course of the catheter. No pneumothorax. Cardiac enlargement with normal pulmonary vascularity. Interstitial fibrosis versus atelectasis in the lung bases. Calcified and tortuous aorta.  No blunting of costophrenic angles.  IMPRESSION: Right PICC line is coiled over the right axillary region.  Tip location cannot be confirmed.  Results were telephoned to [facility] on the IV team at 2203 hours on 01/22/2013.   Original Report Authenticated By: Burman Nieves, M.D.    Scheduled Meds: . aztreonam  1 g Intravenous Q8H  . furosemide  20 mg Intravenous BID  . metoprolol  5 mg Intravenous Q6H   . neomycin-bacitracin-polymyxin   Topical BID  . pantoprazole (PROTONIX) IV  40 mg Intravenous Q24H  . sodium chloride  3 mL Intravenous Q12H   Continuous Infusions:   Principal Problem:   Encephalopathy acute Active Problems:   Cellulitis   Renal failure (ARF), acute on chronic   Hypotension   History of pulmonary embolism   HTN (hypertension)   Anemia   Pure hypercholesterolemia   Chronic kidney disease, unspecified   Hypothermia   UTI (lower urinary tract infection)   Dehydration   Bradycardia   Solitary kidney   Sepsis   Cellulitis of leg   Supratherapeutic INR   Volume overload    Time spent:    Merland Holness K  [facility] Pager [phone]. If 7PM-7AM, please contact night-coverage at [email or web address], password [facility] 01/24/2013, 4:22 PM  LOS: 6 days

## 2013-01-24 NOTE — Progress Notes (Signed)
CRITICAL VALUE ALERT  Critical value received:  PT - 50.9; INR - 5.90  Date of notification:  01/24/2013  Time of notification:  0710  Critical value read back:yes  Nurse who received alert:  Elie Goody, RN  MD notified (1st page):  Dr. Rhona Leavens  Time of first page:  0715  MD notified (2nd page):  Time of second page:  Responding MD:    Time MD responded:

## 2013-01-24 NOTE — Progress Notes (Signed)
Speech Language Pathology Dysphagia Treatment Patient Details Name: [name] MRN: [id number] DOB: 04-Mar-1924 Today's Date: 01/24/2013 Time: 4098-1191 SLP Time Calculation (min): 11 min  Assessment / Plan / Recommendation Clinical Impression  Pt seen after several days NPO, just finished working with PT and able to follow some commands for him. SLP provided max tactile and verbal cues for pt to gain awareness of ice chip to lips, puree trials. Oral care provided given dry, crusted mucosa. Pt improving today, able to masticate ice x2, though followed by prolonged cough response. Pt still not ready for PO, but improving.     Diet Recommendation  Continue with Current Diet: NPO    SLP Plan Continue with current plan of care   Pertinent Vitals/Pain NA   Swallowing Goals  SLP Swallowing Goals Goal #3: Pt. will consume solid texture and thin liquid to determine safest diet versus objective study if appropriate with mod verbal cues. Swallow Study Goal #3 - Progress: Progressing toward goal  General Temperature Spikes Noted: No Respiratory Status: Room air Behavior/Cognition: Lethargic;Doesn't follow directions Oral Cavity - Dentition: Missing dentition Patient Positioning: Upright in bed  Oral Cavity - Oral Hygiene Does patient have any of the following "at risk" factors?: Tongue - coated;Saliva - thick, dry mouth Patient is HIGH RISK - Oral Care Protocol followed (see row info): Yes   Dysphagia Treatment Treatment focused on: Upgraded PO texture trials;Facilitation of oral preparatory phase Treatment Methods/Modalities: Skilled observation;Differential diagnosis Patient observed directly with PO's: Yes Type of PO's observed: Ice chips;Dysphagia 1 (puree) Feeding: Total assist Liquids provided via: Teaspoon Oral Phase Signs & Symptoms: Prolonged oral phase Pharyngeal Phase Signs & Symptoms: Suspected delayed swallow initiation;Delayed throat clear;Delayed cough Type of  cueing: Verbal;Tactile Amount of cueing: Total   GO    Harlon Ditty, [place] CCC-SLP [phone]  [name] 01/24/2013, 1:57 PM

## 2013-01-24 NOTE — Progress Notes (Signed)
NUTRITION FOLLOW UP  Intervention:   1. Recommend initiation of enteral nutrition to meet nutrition needs while pt is unable to safely take oral nutrition.  2. If enteral nutrition is desired, recommend initiation of Jevity 1.2 @ 20 ml/hr and increase by 10 ml q 4 hr to a goal rate of 60 ml/hr and 30 ml Pro-stat BID. This enteral nutrition regimen at goal rate would provide 1784 kcal, 103 gm protein, and 1065 ml free water. Recommend 200 ml free water flush q 6 hr to provide 800 additional ml water and a total of 1865 ml daily.   Nutrition Dx:   Inadequate oral intake related to altered mental status as evidenced by NPO diet and minimal oral intake for 2 weeks PTA. Ongoing   Goal:   Intake to meet >90% of estimated nutrition needs. Unmet   Monitor:   Diet advance vs initiation of enteral nutrition, weight trends, labs, I/O's, skin integrity.   Assessment:   Pt continues to be NPO. Seen again by SLP today, pt improving but not yet ready for PO's at this time per their notes.  Pt has been NPO since 7/4, and had poor oral intake PTA. Pt is at increased risk for malnutrition and skin breakdown. Recommend initiation of enteral nutrition until able to meet nutrition needs with oral intake.   Height: Ht Readings from Last 1 Encounters:  01/18/13 5\' 2"  (1.575 m)    Weight Status:   Wt Readings from Last 1 Encounters:  01/24/13 244 lb 14.9 oz (111.1 kg)  consistent with admission weight   Re-estimated needs:  Kcal: 1600-1800  Protein: 90-100 gm  Fluid: 1.7-2 L  Skin: venous insufficiency wounds to bilateral LE  Diet Order: NPO   Intake/Output Summary (Last 24 hours) at 01/24/13 1546 Last data filed at 01/24/13 0700  Gross per 24 hour  Intake      0 ml  Output   1200 ml  Net  -1200 ml    Last BM: PTA    Labs:   Recent Labs Lab 01/18/13 1938 01/19/13 0545 01/20/13 0423 01/22/13 0530  NA  --  144 147* 147*  K  --  5.7* 5.5* 4.9  CL  --  116* 120* 119*  CO2  --  16*  18* 17*  BUN  --  65* 68* 64*  CREATININE  --  2.00* 2.09* 2.09*  CALCIUM  --  8.5 8.8 9.7  MG 1.7  --   --   --   PHOS 4.5  --   --   --   GLUCOSE  --  90 88 108*    CBG (last 3)  No results found for this basename: GLUCAP,  in the last 72 hours  Scheduled Meds: . aztreonam  1 g Intravenous Q8H  . furosemide  20 mg Intravenous BID  . metoprolol  5 mg Intravenous Q6H  . neomycin-bacitracin-polymyxin   Topical BID  . pantoprazole (PROTONIX) IV  40 mg Intravenous Q24H  . sodium chloride  3 mL Intravenous Q12H    Continuous Infusions:  none   Clarene Duke RD, LDN Pager [phone] After Hours pager [phone]

## 2013-01-25 DIAGNOSIS — D649 Anemia, unspecified: Secondary | ICD-10-CM

## 2013-01-25 LAB — COMPREHENSIVE METABOLIC PANEL
ALT: 17 U/L (ref 0–35)
Alkaline Phosphatase: 67 U/L (ref 39–117)
BUN: 66 mg/dL — ABNORMAL HIGH (ref 6–23)
CO2: 18 mEq/L — ABNORMAL LOW (ref 19–32)
GFR calc Af Amer: 25 mL/min — ABNORMAL LOW (ref >90)
GFR calc non Af Amer: 21 mL/min — ABNORMAL LOW (ref >90)
Glucose, Bld: 82 mg/dL (ref 70–99)
Potassium: 4 mEq/L (ref 3.5–5.1)
Sodium: 152 mEq/L — ABNORMAL HIGH (ref 135–145)
Total Protein: 5.4 g/dL — ABNORMAL LOW (ref 6.0–8.3)

## 2013-01-25 LAB — CBC WITH DIFFERENTIAL/PLATELET
Eosinophils Absolute: 0.2 10*3/uL (ref 0.0–0.7)
Eosinophils Relative: 2 % (ref 0–5)
Hemoglobin: 8.8 g/dL — ABNORMAL LOW (ref 12.0–15.0)
Lymphocytes Relative: 7 % — ABNORMAL LOW (ref 12–46)
Lymphs Abs: 0.9 10*3/uL (ref 0.7–4.0)
MCH: 31 pg (ref 26.0–34.0)
MCV: 95.4 fL (ref 78.0–100.0)
Monocytes Relative: 5 % (ref 3–12)
Neutrophils Relative %: 87 % — ABNORMAL HIGH (ref 43–77)
RBC: 2.84 MIL/uL — ABNORMAL LOW (ref 3.87–5.11)
WBC: 12.7 10*3/uL — ABNORMAL HIGH (ref 4.0–10.5)

## 2013-01-25 LAB — PROTIME-INR: INR: 6.86 (ref 0.00–1.49)

## 2013-01-25 MED ORDER — VITAMIN K1 10 MG/ML IJ SOLN
10.0000 mg | Freq: Once | INTRAVENOUS | Status: AC
  Administered 2013-01-25: 10 mg via INTRAVENOUS
  Filled 2013-01-25 (×3): qty 1

## 2013-01-25 MED ORDER — FUROSEMIDE 10 MG/ML IJ SOLN
20.0000 mg | Freq: Once | INTRAMUSCULAR | Status: AC
  Administered 2013-01-25: 20 mg via INTRAVENOUS

## 2013-01-25 NOTE — Progress Notes (Signed)
[facility] PROGRESS NOTE  [name] ZOX:[id number] DOB: 1923-10-07 DOA: 01/18/2013 PCP: Gretel Acre, MD  Assessment/Plan: Encephalopathy acute:  -Secondary to sepsis and UTI plus dehydration and volume overload.  -N.p.o. until more alert and able to check swallow eval  - Currently on aztreonam -urine is sensitive to current abx. Cont abx  Cellulitis:  -Unna boots removed with no signs of cellulitis  -WOC RN was consulted -At this time her family would like the Unna boots not to be replaced  Renal failure (ARF), acute on chronic:  -Cr Staying around 2.  Hypotension:  - cont to gently hydrate  History of pulmonary embolism:  - Pharmacy following for coumadin - INR supertherapeutic and rising - Give vit K HTN (hypertension):  -Stable and controlled  Anemia  -hgb stable  Pure hypercholesterolemia  Hypothermia:  - Secondary to sepsis.  UTI (lower urinary tract infection):  - Secondary to resistant Pseudomonas.  Bradycardia:  -on Toprol pre admit but was held due to hypotension and bradycardia  -had recurrent atrial fibrillation with a rapid rate after admit so IV Lopressor initiated  Sepsis:  - Secondary to Pseudomonas from UTI.  Volume overload:  -Started IV Lasix  - Echo with EF 50-55%  FEN:  -Discussed with family who does not believe patient would agree w/ feeding tube End of Life: - Does not want feeding tube as above - Pt remains lethargic and ill - Family has interest in discussing case with Palliative Care. Will appreciate their input.  Code Status: DNR Family Communication: Pt and family in room (indicate person spoken with, relationship, and if by phone, the number) Disposition Plan: Pending   Consultants:  Palliative Care  WOC  Procedures: 2-D echocardiogram - EF 50-55%, done 01/22/13  Antibiotics: IV vancomycin and Cipro: 7/4-7/8  IV aztreonam on 7/5>>>  HPI/Subjective: No acute events noted overnight  Objective: Filed Vitals:    01/25/13 0234 01/25/13 0500 01/25/13 0558 01/25/13 0802  BP:   105/75   Pulse: 82  60   Temp: 97.2 F (36.2 C)  97.7 F (36.5 C)   TempSrc: Axillary  Axillary   Resp: 18  18   Height:      Weight:  108.818 kg (239 lb 14.4 oz)  108.909 kg (240 lb 1.6 oz)  SpO2: 97%  94%     Intake/Output Summary (Last 24 hours) at 01/25/13 1130 Last data filed at 01/25/13 1119  Gross per 24 hour  Intake      3 ml  Output    700 ml  Net   -697 ml   Filed Weights   01/24/13 0452 01/25/13 0500 01/25/13 0802  Weight: 111.1 kg (244 lb 14.9 oz) 108.818 kg (239 lb 14.4 oz) 108.909 kg (240 lb 1.6 oz)    Exam:   General:  Lethargic, opens eyes occasionally spontaneously, in nad  Cardiovascular: regular, s1, s2  Respiratory: normal resp effort, no wheezing  Abdomen: soft, nondistended  Musculoskeletal: perfused, no clubbing   Data Reviewed: Basic Metabolic Panel:  Recent Labs Lab 01/18/13 1141 01/18/13 1938 01/19/13 0545 01/20/13 0423 01/22/13 0530 01/25/13 0555  NA 141  --  144 147* 147* 152*  K 5.0  --  5.7* 5.5* 4.9 4.0  CL 109  --  116* 120* 119* 119*  CO2 21  --  16* 18* 17* 18*  GLUCOSE 93  --  90 88 108* 82  BUN 68*  --  65* 68* 64* 66*  CREATININE 1.99*  --  2.00*  2.09* 2.09* 2.00*  CALCIUM 9.3  --  8.5 8.8 9.7 9.6  MG  --  1.7  --   --   --   --   PHOS  --  4.5  --   --   --   --    Liver Function Tests:  Recent Labs Lab 01/18/13 1141 01/19/13 0545 01/22/13 0530 01/25/13 0555  AST 29 23 14 16   ALT 33 26 22 17   ALKPHOS 76 71 67 67  BILITOT 0.2* 0.2* 0.2* 0.3  PROT 6.0 5.4* 5.6* 5.4*  ALBUMIN 2.7* 2.3* 2.3* 2.0*   No results found for this basename: LIPASE, AMYLASE,  in the last 168 hours No results found for this basename: AMMONIA,  in the last 168 hours CBC:  Recent Labs Lab 01/18/13 1141 01/19/13 0545 01/20/13 0423 01/22/13 0530 01/25/13 0555  WBC 13.9* 12.4* 9.3 8.5 12.7*  NEUTROABS 12.7*  --   --   --  11.0*  HGB 9.2* 8.4* 8.3* 8.6* 8.8*  HCT  27.4* 25.7* 25.3* 27.5* 27.1*  MCV 94.8 95.2 95.5 96.5 95.4  PLT 173 163 167 167 171   Cardiac Enzymes: No results found for this basename: CKTOTAL, CKMB, CKMBINDEX, TROPONINI,  in the last 168 hours BNP (last 3 results)  Recent Labs  01/22/13 0530  PROBNP 9436.0*   CBG:  Recent Labs Lab 01/18/13 1202  GLUCAP 99    Recent Results (from the past 240 hour(s))  URINE CULTURE     Status: None   Collection Time    01/18/13 11:48 AM      Result Value Range Status   Specimen Description URINE, RANDOM   Final   Special Requests NONE   Final   Culture  Setup Time 01/18/2013 19:13   Final   Colony Count >=100,000 COLONIES/ML   Final   Culture     Final   Value: Multiple bacterial morphotypes present, none predominant. Suggest appropriate recollection if clinically indicated.   Report Status 01/20/2013 FINAL   Final  CULTURE, BLOOD (ROUTINE X 2)     Status: None   Collection Time    01/18/13 12:15 PM      Result Value Range Status   Specimen Description BLOOD ARM LEFT   Final   Special Requests BOTTLES DRAWN AEROBIC AND ANAEROBIC 10CC   Final   Culture  Setup Time 01/18/2013 18:48   Final   Culture     Final   Value: PSEUDOMONAS AERUGINOSA     Note: AZTREONAM SENSITIVE 2ug/mL     Note: Gram Stain Report Called to,Read Back By and Verified With: WILLIAM RICE 01/19/13 @ 9:23PM BY RUSCOE A.   Report Status 01/24/2013 FINAL   Final   Organism ID, Bacteria PSEUDOMONAS AERUGINOSA   Final  CULTURE, BLOOD (ROUTINE X 2)     Status: None   Collection Time    01/18/13 12:20 PM      Result Value Range Status   Specimen Description BLOOD ARM RIGHT   Final   Special Requests BOTTLES DRAWN AEROBIC ONLY 5CC   Final   Culture  Setup Time 01/18/2013 18:48   Final   Culture     Final   Value: STAPHYLOCOCCUS SPECIES (COAGULASE NEGATIVE)     Note: THE SIGNIFICANCE OF ISOLATING THIS ORGANISM FROM A SINGLE SET OF BLOOD CULTURES WHEN MULTIPLE SETS ARE DRAWN IS UNCERTAIN. PLEASE NOTIFY THE  MICROBIOLOGY DEPARTMENT WITHIN ONE WEEK IF SPECIATION AND SENSITIVITIES ARE REQUIRED.     Note: Gram  Stain Report Called to,Read Back By and Verified With: Asencion Gowda RN on 01/20/13 at 00:55 by Christie Nottingham   Report Status 01/21/2013 FINAL   Final  MRSA PCR SCREENING     Status: None   Collection Time    01/18/13  4:24 PM      Result Value Range Status   MRSA by PCR NEGATIVE  NEGATIVE Final   Comment:            The GeneXpert MRSA Assay (FDA     approved for NASAL specimens     only), is one component of a     comprehensive MRSA colonization     surveillance program. It is not     intended to diagnose MRSA     infection nor to guide or     monitor treatment for     MRSA infections.     Studies: Ir Fluoro Guide Cv Line Right  01/23/2013   *RADIOLOGY REPORT*  Clinical history:Malpositioned right arm PICC line.  PROCEDURE(S): EXCHANGE OF RIGHT ARM PICC LINE WITH FLUOROSCOPY  Physician: Rachelle Hora. Henn, MD  Medications:None  Moderate sedation time:None  Fluoroscopy time: 2 minutes  Procedure:Informed consent was obtained for a PICC line exchange. The right upper arm was prepped and draped in a sterile fashion. Maximal barrier sterile technique was utilized including caps, mask, sterile gowns, sterile gloves, sterile drape, hand hygiene and skin antiseptic.  The catheter was cut and removed over a wire. The wire took a very tortuous course in the shoulder.  Eventually, a 5-French Kumpe catheter and glidewire were advanced into the SVC. A single lumen Power PICC line was cut to 44 cm and advanced over the Glidewire.  Catheter was placed at the junction of superior vena cava and right atrium.  Catheter flushed and aspirated well. Skin was anesthetized with lidocaine.  Catheter sutured to the skin.  Findings:The old catheter was coiled in the right subclavianvein region.  New catheter tip at the SVC/RA junction.  Complications: None  Impression:Successful exchange of the right arm PICC line.   Original  Report Authenticated By: Richarda Overlie, M.D.   Dg Chest Port 1 View  01/24/2013   *RADIOLOGY REPORT*  Clinical Data: Shortness of breath.  Hypertension.  History of pulmonary embolus.  PORTABLE CHEST - 1 VIEW  Comparison: 01/23/2013  Findings: Right PICC line is stable in position.  Shallow inspiration.  Mild cardiac enlargement with pulmonary vascular congestion.  Mild interstitial edema.  No developing consolidation. Atelectasis in the left lung base.  No pneumothorax.  Calcification of the aorta.  IMPRESSION: Stable appearance of the chest since previous study.  Continued cardiac enlargement pulmonary vascular congestion with mild edema. Atelectasis left lung base.   Original Report Authenticated By: Burman Nieves, M.D.   Dg Chest Port 1 View  01/23/2013   *RADIOLOGY REPORT*  Clinical Data: The PICC line placement.  PORTABLE CHEST - 1 VIEW  Comparison: 01/23/2013  Findings: Right PICC line is in place with the tip in the SVC. Mild cardiomegaly.  Bibasilar atelectasis and vascular congestion. Decreasing lung volumes since prior study.  IMPRESSION: Right PICC line pulled back with the tip now in the SVC.  Shallow inspiration.  Vascular congestion and bibasilar atelectasis.   Original Report Authenticated By: Charlett Nose, M.D.   Dg Chest Port 1 View  01/23/2013   *RADIOLOGY REPORT*  Clinical Data: PICC line placement  PORTABLE CHEST - 1 VIEW  Comparison: Portable exam 2054 hours compared to 01/22/2013  Findings: Tip  of right arm PICC line projects over right atrium. Enlargement of cardiac silhouette with pulmonary vascular congestion. Calcified tortuous aorta. Bibasilar atelectasis. Bronchitic and question emphysematous changes. Question mild infiltrate right upper lobe. No gross pleural effusion or pneumothorax.  IMPRESSION: Tip of right arm PICC line projects over right atrium; recommend withdrawal 5 cm for positioning at cavoatrial junction. Enlargement of cardiac silhouette with pulmonary vascular  congestion. Bronchitic question emphysematous changes with bibasilar atelectasis and questionable right upper lobe infiltrate.   Original Report Authenticated By: Ulyses Southward, M.D.    Scheduled Meds: . aztreonam  1 g Intravenous Q8H  . furosemide  20 mg Intravenous BID  . furosemide  20 mg Intravenous Once  . metoprolol  5 mg Intravenous Q6H  . neomycin-bacitracin-polymyxin   Topical BID  . pantoprazole (PROTONIX) IV  40 mg Intravenous Q24H  . phytonadione (VITAMIN K) IV  10 mg Intravenous Once  . sodium chloride  3 mL Intravenous Q12H   Continuous Infusions:   Principal Problem:   Encephalopathy acute Active Problems:   Cellulitis   Renal failure (ARF), acute on chronic   Hypotension   History of pulmonary embolism   HTN (hypertension)   Anemia   Pure hypercholesterolemia   Chronic kidney disease, unspecified   Hypothermia   UTI (lower urinary tract infection)   Dehydration   Bradycardia   Solitary kidney   Sepsis   Cellulitis of leg   Supratherapeutic INR   Volume overload    Time spent:    Latyra Jaye K  [facility] Pager [phone]. If 7PM-7AM, please contact night-coverage at [email or web address], password [facility] 01/25/2013, 11:30 AM  LOS: 7 days

## 2013-01-25 NOTE — Progress Notes (Signed)
UR COMPLETED  

## 2013-01-25 NOTE — Progress Notes (Signed)
Speech Language Pathology Dysphagia Treatment Patient Details Name: [name] MRN: [id number] DOB: 05-21-1924 Today's Date: 01/25/2013 Time: 1030-1101 SLP Time Calculation (min): 31 min  Assessment / Plan / Recommendation Clinical Impression  Very good session today. Initially pt somnolent. SLP repositioned and provided thorough oral care. Pt required max tactile cues to become aware of ice chip, then progressively pt more aware, requesting more and masticating ans swallowin ice chip rapidly. Provided trials of puree, again pt required max tactile and verbal cues to orally manipulate. Pt progressively improved, but continued to need verbal cue to initate swallow response. Minimal signs of aspiration with puree. Trials of thin also attempted with immediate, strong cough response indicative of aspiration. Pt is not ready to initiate diet because of AMS and need for skilled cueing to facilitate intake. However, would recommend 3-4 ice chips every hour following oral care to facilitate oral health and swallow function. Will f/u in one day, hopeful for continued progress and initiation of conservative diet if arousal is sustained and swallow response elicited without verbal cues.     Diet Recommendation  Continue with Current Diet: NPO Initiate / Change Diet: Other (comment) (ice chips following oral care. )    SLP Plan Continue with current plan of care   Pertinent Vitals/Pain NA   Swallowing Goals  SLP Swallowing Goals Goal #3: Pt will consume trials of puree with moderate verbal cues and automatic swallow response Swallow Study Goal #3 - Progress: Progressing toward goal  General Temperature Spikes Noted: No Respiratory Status: Room air Behavior/Cognition: Lethargic;Doesn't follow directions Oral Cavity - Dentition: Missing dentition Patient Positioning: Upright in bed  Oral Cavity - Oral Hygiene Does patient have any of the following "at risk" factors?: None of the above Brush  patient's teeth BID with toothbrush (using toothpaste with fluoride): Yes Patient is HIGH RISK - Oral Care Protocol followed (see row info): Yes   Dysphagia Treatment Treatment focused on: Upgraded PO texture trials;Facilitation of oral preparatory phase Family/Caregiver Educated: daughter Treatment Methods/Modalities: Skilled observation;Differential diagnosis Patient observed directly with PO's: Yes Type of PO's observed: Dysphagia 1 (puree);Thin liquids;Ice chips Feeding: Total assist Liquids provided via: Teaspoon;Cup;Straw Oral Phase Signs & Symptoms: Prolonged bolus formation;Prolonged oral phase;Prolonged mastication Pharyngeal Phase Signs & Symptoms: Suspected delayed swallow initiation;Immediate cough;Delayed throat clear;Wet vocal quality Type of cueing: Verbal;Tactile Amount of cueing: Maximal   GO    Audrey Ditty, MA CCC-SLP [phone]  [name] 01/25/2013, 11:11 AM

## 2013-01-25 NOTE — Progress Notes (Signed)
Thank you for consulting the Palliative Medicine Team at [facility] to meet your patient's and family's needs.   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

## 2013-01-26 DIAGNOSIS — F411 Generalized anxiety disorder: Secondary | ICD-10-CM

## 2013-01-26 DIAGNOSIS — Z515 Encounter for palliative care: Secondary | ICD-10-CM

## 2013-01-26 DIAGNOSIS — L0291 Cutaneous abscess, unspecified: Secondary | ICD-10-CM

## 2013-01-26 DIAGNOSIS — M79609 Pain in unspecified limb: Secondary | ICD-10-CM

## 2013-01-26 LAB — CBC WITH DIFFERENTIAL/PLATELET
Basophils Absolute: 0 10*3/uL (ref 0.0–0.1)
Basophils Relative: 0 % (ref 0–1)
Eosinophils Absolute: 0.2 10*3/uL (ref 0.0–0.7)
Eosinophils Relative: 1 % (ref 0–5)
Lymphs Abs: 0.9 10*3/uL (ref 0.7–4.0)
MCH: 30.8 pg (ref 26.0–34.0)
MCHC: 32.7 g/dL (ref 30.0–36.0)
MCV: 94.4 fL (ref 78.0–100.0)
Neutrophils Relative %: 87 % — ABNORMAL HIGH (ref 43–77)
Platelets: 176 10*3/uL (ref 150–400)
RBC: 2.66 MIL/uL — ABNORMAL LOW (ref 3.87–5.11)
RDW: 14.7 % (ref 11.5–15.5)

## 2013-01-26 LAB — COMPREHENSIVE METABOLIC PANEL
ALT: 15 U/L (ref 0–35)
AST: 14 U/L (ref 0–37)
Albumin: 1.8 g/dL — ABNORMAL LOW (ref 3.5–5.2)
Alkaline Phosphatase: 56 U/L (ref 39–117)
Calcium: 9.2 mg/dL (ref 8.4–10.5)
GFR calc Af Amer: 24 mL/min — ABNORMAL LOW (ref >90)
Glucose, Bld: 92 mg/dL (ref 70–99)
Potassium: 3.5 mEq/L (ref 3.5–5.1)
Sodium: 153 mEq/L — ABNORMAL HIGH (ref 135–145)
Total Protein: 4.9 g/dL — ABNORMAL LOW (ref 6.0–8.3)

## 2013-01-26 LAB — PROTIME-INR: Prothrombin Time: 22.9 seconds — ABNORMAL HIGH (ref 11.6–15.2)

## 2013-01-26 MED ORDER — DEXTROSE 5 % IV SOLN
500.0000 mg | Freq: Three times a day (TID) | INTRAVENOUS | Status: DC
  Filled 2013-01-26 (×2): qty 0.5

## 2013-01-26 MED ORDER — LORAZEPAM 2 MG/ML IJ SOLN
0.5000 mg | INTRAMUSCULAR | Status: DC | PRN
  Administered 2013-01-28: 0.5 mg via INTRAVENOUS
  Filled 2013-01-26: qty 1

## 2013-01-26 MED ORDER — ATROPINE SULFATE 1 % OP SOLN
4.0000 [drp] | OPHTHALMIC | Status: DC | PRN
  Filled 2013-01-26: qty 2

## 2013-01-26 NOTE — Progress Notes (Signed)
ANTIBIOTIC CONSULT NOTE - FOLLOW UP  Pharmacy Consult for Aztreonam Indication: Pseudomonas Bacteremia  Allergies  Allergen Reactions  . Penicillins Anaphylaxis  . Codeine     Verified with daughter- N/V "violently ill"    Patient Measurements: Height: 5\' 2"  (157.5 cm) Weight: 241 lb 4.8 oz (109.453 kg) IBW/kg (Calculated) : 50.1  Vital Signs: Temp: 98.1 F (36.7 C) (07/12 0524) Temp src: Axillary (07/12 0524) BP: 103/46 mmHg (07/12 0524) Pulse Rate: 76 (07/12 0524) Intake/Output from previous day: 07/11 0701 - 07/12 0700 In: 53 [I.V.:3; IV Piggyback:50] Out: 1450 [Urine:1450] Intake/Output from this shift:    Labs:  Recent Labs  01/25/13 0555 01/26/13 0500  WBC 12.7* 13.4*  HGB 8.8* 8.2*  PLT 171 176  CREATININE 2.00* 2.05*   Estimated Creatinine Clearance: 22.6 ml/min (by C-G formula based on Cr of 2.05). No results found for this basename: VANCOTROUGH, Leodis Binet, VANCORANDOM, GENTTROUGH, GENTPEAK, GENTRANDOM, TOBRATROUGH, TOBRAPEAK, TOBRARND, AMIKACINPEAK, AMIKACINTROU, AMIKACIN,  in the last 72 hours   Microbiology: Recent Results (from the past 720 hour(s))  URINE CULTURE     Status: None   Collection Time    01/18/13 11:48 AM      Result Value Range Status   Specimen Description URINE, RANDOM   Final   Special Requests NONE   Final   Culture  Setup Time 01/18/2013 19:13   Final   Colony Count >=100,000 COLONIES/ML   Final   Culture     Final   Value: Multiple bacterial morphotypes present, none predominant. Suggest appropriate recollection if clinically indicated.   Report Status 01/20/2013 FINAL   Final  CULTURE, BLOOD (ROUTINE X 2)     Status: None   Collection Time    01/18/13 12:15 PM      Result Value Range Status   Specimen Description BLOOD ARM LEFT   Final   Special Requests BOTTLES DRAWN AEROBIC AND ANAEROBIC 10CC   Final   Culture  Setup Time 01/18/2013 18:48   Final   Culture     Final   Value: PSEUDOMONAS AERUGINOSA     Note:  AZTREONAM SENSITIVE 2ug/mL     Note: Gram Stain Report Called to,Read Back By and Verified With: WILLIAM RICE 01/19/13 @ 9:23PM BY RUSCOE A.   Report Status 01/24/2013 FINAL   Final   Organism ID, Bacteria PSEUDOMONAS AERUGINOSA   Final  CULTURE, BLOOD (ROUTINE X 2)     Status: None   Collection Time    01/18/13 12:20 PM      Result Value Range Status   Specimen Description BLOOD ARM RIGHT   Final   Special Requests BOTTLES DRAWN AEROBIC ONLY 5CC   Final   Culture  Setup Time 01/18/2013 18:48   Final   Culture     Final   Value: STAPHYLOCOCCUS SPECIES (COAGULASE NEGATIVE)     Note: THE SIGNIFICANCE OF ISOLATING THIS ORGANISM FROM A SINGLE SET OF BLOOD CULTURES WHEN MULTIPLE SETS ARE DRAWN IS UNCERTAIN. PLEASE NOTIFY THE MICROBIOLOGY DEPARTMENT WITHIN ONE WEEK IF SPECIATION AND SENSITIVITIES ARE REQUIRED.     Note: Gram Stain Report Called to,Read Back By and Verified With: Asencion Gowda RN on 01/20/13 at 00:55 by Christie Nottingham   Report Status 01/21/2013 FINAL   Final  MRSA PCR SCREENING     Status: None   Collection Time    01/18/13  4:24 PM      Result Value Range Status   MRSA by PCR NEGATIVE  NEGATIVE Final   Comment:  The GeneXpert MRSA Assay (FDA     approved for NASAL specimens     only), is one component of a     comprehensive MRSA colonization     surveillance program. It is not     intended to diagnose MRSA     infection nor to guide or     monitor treatment for     MRSA infections.    Anti-infectives   Start     Dose/Rate Route Frequency Ordered Stop   01/23/13 1130  sulfamethoxazole-trimethoprim (BACTRIM DS) 800-160 MG per tablet 1 tablet  Status:  Discontinued     1 tablet Oral Every 12 hours 01/23/13 1125 01/23/13 1128   01/21/13 1600  imipenem-cilastatin (PRIMAXIN) 250 mg in sodium chloride 0.9 % 100 mL IVPB  Status:  Discontinued     250 mg 200 mL/hr over 30 Minutes Intravenous Every 6 hours 01/21/13 1447 01/21/13 1618   01/20/13 1600  vancomycin (VANCOCIN)  1,500 mg in sodium chloride 0.9 % 500 mL IVPB  Status:  Discontinued     1,500 mg 250 mL/hr over 120 Minutes Intravenous Every 48 hours 01/18/13 1446 01/21/13 1407   01/19/13 2200  aztreonam (AZACTAM) 1 g in dextrose 5 % 50 mL IVPB     1 g 100 mL/hr over 30 Minutes Intravenous 3 times per day 01/19/13 2136     01/19/13 1400  ciprofloxacin (CIPRO) IVPB 400 mg  Status:  Discontinued     400 mg 200 mL/hr over 60 Minutes Intravenous Every 24 hours 01/18/13 1446 01/21/13 1443   01/18/13 2200  metroNIDAZOLE (FLAGYL) IVPB 500 mg  Status:  Discontinued     500 mg 100 mL/hr over 60 Minutes Intravenous Every 8 hours 01/18/13 1300 01/18/13 1635   01/18/13 2200  aztreonam (AZACTAM) 1 g in dextrose 5 % 50 mL IVPB  Status:  Discontinued     1 g 100 mL/hr over 30 Minutes Intravenous 3 times per day 01/18/13 1446 01/18/13 1635   01/18/13 1400  vancomycin (VANCOCIN) 2,000 mg in sodium chloride 0.9 % 500 mL IVPB     2,000 mg 250 mL/hr over 120 Minutes Intravenous To Emergency Dept 01/18/13 1300 01/18/13 1712   01/18/13 1400  ciprofloxacin (CIPRO) IVPB 400 mg  Status:  Discontinued     400 mg 200 mL/hr over 60 Minutes Intravenous  Once 01/18/13 1358 01/21/13 1443   01/18/13 1145  aztreonam (AZACTAM) 2 g in dextrose 5 % 50 mL IVPB     2 g 100 mL/hr over 30 Minutes Intravenous  Once 01/18/13 1141 01/18/13 1356   01/18/13 1145  metroNIDAZOLE (FLAGYL) IVPB 500 mg     500 mg 100 mL/hr over 60 Minutes Intravenous  Once 01/18/13 1141 01/18/13 1512   01/18/13 1145  vancomycin (VANCOCIN) IVPB 1000 mg/200 mL premix  Status:  Discontinued     1,000 mg 200 mL/hr over 60 Minutes Intravenous  Once 01/18/13 1141 01/18/13 1258      Assessment: 79 year old female admitted with UTI and sepsis, now found to have Pseudomonas bacteremia.  She is currently being treated with Aztreonam. Est CrCl <30 ml/min  Plan:  Change Aztreonam to 500 mg IV q8h Monitor renal function   Thank you Talbert Cage, PharmD Pager  [phone]  01/26/2013, 7:34 AM

## 2013-01-26 NOTE — Progress Notes (Signed)
Bilateral lower extremity wound cleansed with NS and gently patted dry. Areas of skin breakdown covered with interDry Ag. Patient medicated for pain prior to dressing change.

## 2013-01-26 NOTE — Progress Notes (Signed)
Informed by NT that pt's HR was elevated during routine VS.  EKG obtained and given to Dr. Rhona Leavens.  Will continue to monitor.  [name], [facility]

## 2013-01-26 NOTE — Consult Note (Signed)
Patient [id number] [name]      DOB: 12/04/1923      WGN:[id number]  Summary of Goals of Care; full note to follow:  Met with daughter [name], her spouse [name],  And grandchildren [name], and [name] with their respective spouses   Family has seen the chronic decline over the past month.  They expressed that they feel she is at or near death.  They would like to focus on symptom management for comfort purposes.   No further lab draws, no antibiotics, aggressive pain management and other symptom management.  If it seems that she is stable but declining and needs to be moved form the hospital they would like to consider residential hospital .  At this time it appears that she has a prognosis of days and may not survive the weekend.    Will talk with wound care regarding less dressing changes to promote comfort    Total time:  1015 am- 1155am   Cyan Moultrie L. Ladona Ridgel, MD MBA The Palliative Medicine Team at [facility] Phone: [phone] Pager: [phone]

## 2013-01-26 NOTE — Consult Note (Signed)
Patient [name]      DOB: Oct 29, 1923      QMV:[id number]     Consult Note from the Palliative Medicine Team at [facility]    Consult Requested by: Dr. Rhona Leavens    PCP: Gretel Acre, MD Reason for Consultation: GOC     Phone [phone]  Assessment of patients Current state:  Patient is an 79 year old white female has had a recent overall decline related to persistent cellulitis of lower extremities. She has now been found to have a Pseudomonas bacteremia and appears to be succumbing to this abscess. The family assert that they understand she is likely dying and within the context of her recent decline he desired to pursue full comfort measures which includes discontinuing antibiotic therapy, simplifying wound care so as not to cause pain, and comfort feeding if the patient desires to eat. Goals of Care: 1.  Code Status: DO NOT RESUSCITATE DO NOT INTUBATE   2. Scope of Treatment: 1. Vital Signs: Vital signs only when being given metoprolol and full set daily 2. Respiratory/Oxygen: Continue when necessary oxygen for comfort 3. Nutritional Support/Tube Feeds: Comfort feed by mouth no tube feedings 4. Antibiotics:Discontinue all antibiotics 5. IVF: Only to keep line open 6. Review of Medications to be discontinued: Unnecessary medications, and antibiotics 7. Labs: No further laboratory studies 8. Telemetry: Could be discontinued 9. Consults: I will talk to the wound care team as soon as possible to simplify her dressing changes for now nursing should hold on changing dressings   4. Disposition:  Depending on patient's rate of decline, she may have a hospital death. If the patient stable for transition on Monday residential hospice would be appropriate in family would be open to transition.   3. Symptom Management:   1. Anxiety/Agitation: Will add low-dose Ativan for as needed treatment for agitation 2. Pain: Continue morphine every 2 hours when necessary for pain will  consider scheduling depending on how much medication she needs the next 24 hours. We'll likely convert to Roxanol with scheduled dosing in the a.m. 3. Bowel Regimen: Monitor. May use to collect suppository if patient uncomfortable 4. Fever: Tylenol when necessary 5. Nausea/Vomiting: When necessary Zofran 6. Terminal Secretions: As needed atropine drops  4. 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   5. Spiritual: she does state that she relies on his team minister to her family's pastor         Patient Documents Completed or Given: Document Given Completed  Advanced Directives Pkt    MOST    DNR    Gone from My Sight    Hard Choices      Brief HPI: Patient is an 79 year old female who was recently hospitalized in transitioned on to rehabilitation after being hospitalized related to lower extremity cellulitis with acute renal failure. The patient was placed subsequently in the skilled nursing facility and returns with worsening altered mental status. She's been diagnosed with a Pseudomonas bacteremia. She passed persistent lower extremity wounds which are likely the source. Her course is been complicated by hypotension, bradycardia and now A. fib with RVR. Patient does appear to be near death which is where the family feels that she lives in the course of her illness. They would like to focus on pure comfort and dignity. They would like to discontinue antibiotic therapy and treat her symptomatically for pain and fever nausea and shortness of breath. The patient's prognosis is  likely days to weeks. She is not eating they do not desire to have a feeding tube. Depending on condition residential hospice may be pursued Monday morning.   ROS: Unable to be obtained due to patient's poor cognitive status    PMH:  Past Medical History  Diagnosis Date  . Hypertension   . Swelling of left lower extremity chronic  .  Swelling of right lower extremity chronic  . Urinary incontinence   . PE (pulmonary embolism) 2006  . DVT (deep venous thrombosis) 2006  . Hypothyroidism   . Renal disorder   . Spinal stenosis     L4, L5  . Solitary kidney 01/18/2013     PSH: Past Surgical History  Procedure Laterality Date  . Abdominal hysterectomy    . Appendectomy    . Knee surgery     I have reviewed the FH and SH and  If appropriate update it with new information. Allergies  Allergen Reactions  . Penicillins Anaphylaxis  . Codeine     Verified with daughter- N/V "violently ill"   Scheduled Meds: . furosemide  20 mg Intravenous BID  . metoprolol  5 mg Intravenous Q6H  . neomycin-bacitracin-polymyxin   Topical BID  . pantoprazole (PROTONIX) IV  40 mg Intravenous Q24H  . sodium chloride  3 mL Intravenous Q12H   Continuous Infusions:  PRN Meds:.acetaminophen, atropine, diphenhydrAMINE, LORazepam, morphine injection, ondansetron (ZOFRAN) IV, sodium chloride    BP 95/48  Pulse 146  Temp(Src) 98.8 F (37.1 C) (Axillary)  Resp 18  Ht 5\' 2"  (1.575 m)  Wt 109 kg (240 lb 4.8 oz)  BMI 43.94 kg/m2  SpO2 99%   PPS: 10%  Intake/Output Summary (Last 24 hours) at 01/26/13 1334 Last data filed at 01/26/13 1610  Gross per 24 hour  Intake    800 ml  Output    950 ml  Net   -150 ml   LBM: Prior to admission                 Physical Exam:  General: Minimally responsive will crack her eyes open slightly to tactile and verbal stimuli, brow is furrowed HEENT:  Pupils appear equal and round chest exam cannot fully assess extraocular muscles mucous membranes are dry Chest:   Decreased with basilar crackles no wheezing CVS: Tachycardic and irregular positive S1 and S2 I don't appreciate an S3 or S4 Abdomen: Obese soft no grimace on palpation Ext: Bilateral wounds dressed with noted exudate on dressings Neuro: Minimally responsive not verbal will open eyes slightly to verbal and stimuli  Labs: CBC     Component Value Date/Time   WBC 13.4* 01/26/2013 0500   RBC 2.66* 01/26/2013 0500   HGB 8.2* 01/26/2013 0500   HCT 25.1* 01/26/2013 0500   PLT 176 01/26/2013 0500   MCV 94.4 01/26/2013 0500   MCH 30.8 01/26/2013 0500   MCHC 32.7 01/26/2013 0500   RDW 14.7 01/26/2013 0500   LYMPHSABS 0.9 01/26/2013 0500   MONOABS 0.6 01/26/2013 0500   EOSABS 0.2 01/26/2013 0500   BASOSABS 0.0 01/26/2013 0500      CMP     Component Value Date/Time   NA 153* 01/26/2013 0500   K 3.5 01/26/2013 0500   CL 120* 01/26/2013 0500   CO2 21 01/26/2013 0500   GLUCOSE 92 01/26/2013 0500   BUN 65* 01/26/2013 0500   CREATININE 2.05* 01/26/2013 0500   CALCIUM 9.2 01/26/2013 0500   PROT 4.9* 01/26/2013 0500   ALBUMIN 1.8* 01/26/2013  0500   AST 14 01/26/2013 0500   ALT 15 01/26/2013 0500   ALKPHOS 56 01/26/2013 0500   BILITOT 0.4 01/26/2013 0500   GFRNONAA 21* 01/26/2013 0500   GFRAA 24* 01/26/2013 0500    Chest Xray Reviewed/Impressions: Pulmonary vascular congestion and mild edema atelectasis in the left lung  CT scan of the Head Reviewed/Impressions: Diffuse atrophy   Time In Time Out Total Time Spent with Patient Total Overall Time  10 15 am 1155 am 20 min 100 and min    Greater than 50%  of this time was spent counseling and coordinating care related to the above assessment and plan.   Discussed with Dr. Lendon Collar L. Ladona Ridgel, MD MBA The Palliative Medicine Team at [facility] Phone: [phone] Pager: [phone]

## 2013-01-26 NOTE — Progress Notes (Signed)
[facility] PROGRESS NOTE  [name] ZOX:[id number] DOB: 11-30-1923 DOA: 01/18/2013 PCP: Gretel Acre, MD  Assessment/Plan: Encephalopathy acute:  -Secondary to sepsis and UTI plus dehydration and volume overload.  -Stopped abx secondary to comfort measures Cellulitis:  -for now, dressing changes per WOC Renal failure (ARF), acute on chronic:  -no more bloodwork secondary to comfort care Hypotension:  - on iv lopressor History of pulmonary embolism:  -INR was supertherapeutic, since d/c'd -no plans to resume secondary to comfort care HTN (hypertension):  -Stable  Anemia  -no further bloodwork as above .  UTI (lower urinary tract infection):  - Secondary to resistant Pseudomonas - Abx stopped secondary to comfort measures.  Bradycardia:  -had recurrent atrial fibrillation with a rapid rate after admit so IV Lopressor initiated.   End of Life: - Does not want feeding tube - Greatly appreciate input from Palliative Care - Comfort measures only. Stop abx and stop bloodwork  Code Status: DNR Family Communication: Pt and family in room (indicate person spoken with, relationship, and if by phone, the number) Disposition Plan: Comfort Care   Consultants:  Palliative Care  WOC  Procedures: 2-D echocardiogram - EF 50-55%, done 01/22/13  Antibiotics: IV vancomycin and Cipro: 7/4-7/8  IV aztreonam on 7/5>>>01/26/13  HPI/Subjective: No significant change. See Palliative Care note.  Objective: Filed Vitals:   01/26/13 0500 01/26/13 0524 01/26/13 0909 01/26/13 1041  BP:  103/46  95/48  Pulse:  76  146  Temp:  98.1 F (36.7 C)  98.8 F (37.1 C)  TempSrc:  Axillary  Axillary  Resp:  16  18  Height:      Weight: 109.453 kg (241 lb 4.8 oz)  109 kg (240 lb 4.8 oz)   SpO2:  99%  99%    Intake/Output Summary (Last 24 hours) at 01/26/13 1226 Last data filed at 01/26/13 0611  Gross per 24 hour  Intake    800 ml  Output    950 ml  Net   -150 ml   Filed  Weights   01/25/13 0802 01/26/13 0500 01/26/13 0909  Weight: 108.909 kg (240 lb 1.6 oz) 109.453 kg (241 lb 4.8 oz) 109 kg (240 lb 4.8 oz)    Exam:   General:  Lethargic, poorly responsive  Cardiovascular: irregularly irregular  Respiratory: shallow breaths, in no resp distress, no audible wheezing  Abdomen: soft, obese  Musculoskeletal: perfused, no clubbing   Data Reviewed: Basic Metabolic Panel:  Recent Labs Lab 01/20/13 0423 01/22/13 0530 01/25/13 0555 01/26/13 0500  NA 147* 147* 152* 153*  K 5.5* 4.9 4.0 3.5  CL 120* 119* 119* 120*  CO2 18* 17* 18* 21  GLUCOSE 88 108* 82 92  BUN 68* 64* 66* 65*  CREATININE 2.09* 2.09* 2.00* 2.05*  CALCIUM 8.8 9.7 9.6 9.2   Liver Function Tests:  Recent Labs Lab 01/22/13 0530 01/25/13 0555 01/26/13 0500  AST 14 16 14   ALT 22 17 15   ALKPHOS 67 67 56  BILITOT 0.2* 0.3 0.4  PROT 5.6* 5.4* 4.9*  ALBUMIN 2.3* 2.0* 1.8*   No results found for this basename: LIPASE, AMYLASE,  in the last 168 hours No results found for this basename: AMMONIA,  in the last 168 hours CBC:  Recent Labs Lab 01/20/13 0423 01/22/13 0530 01/25/13 0555 01/26/13 0500  WBC 9.3 8.5 12.7* 13.4*  NEUTROABS  --   --  11.0* 11.6*  HGB 8.3* 8.6* 8.8* 8.2*  HCT 25.3* 27.5* 27.1* 25.1*  MCV 95.5 96.5 95.4  94.4  PLT 167 167 171 176   Cardiac Enzymes: No results found for this basename: CKTOTAL, CKMB, CKMBINDEX, TROPONINI,  in the last 168 hours BNP (last 3 results)  Recent Labs  01/22/13 0530  PROBNP 9436.0*   CBG: No results found for this basename: GLUCAP,  in the last 168 hours  Recent Results (from the past 240 hour(s))  URINE CULTURE     Status: None   Collection Time    01/18/13 11:48 AM      Result Value Range Status   Specimen Description URINE, RANDOM   Final   Special Requests NONE   Final   Culture  Setup Time 01/18/2013 19:13   Final   Colony Count >=100,000 COLONIES/ML   Final   Culture     Final   Value: Multiple  bacterial morphotypes present, none predominant. Suggest appropriate recollection if clinically indicated.   Report Status 01/20/2013 FINAL   Final  CULTURE, BLOOD (ROUTINE X 2)     Status: None   Collection Time    01/18/13 12:15 PM      Result Value Range Status   Specimen Description BLOOD ARM LEFT   Final   Special Requests BOTTLES DRAWN AEROBIC AND ANAEROBIC 10CC   Final   Culture  Setup Time 01/18/2013 18:48   Final   Culture     Final   Value: PSEUDOMONAS AERUGINOSA     Note: AZTREONAM SENSITIVE 2ug/mL     Note: Gram Stain Report Called to,Read Back By and Verified With: WILLIAM RICE 01/19/13 @ 9:23PM BY RUSCOE A.   Report Status 01/24/2013 FINAL   Final   Organism ID, Bacteria PSEUDOMONAS AERUGINOSA   Final  CULTURE, BLOOD (ROUTINE X 2)     Status: None   Collection Time    01/18/13 12:20 PM      Result Value Range Status   Specimen Description BLOOD ARM RIGHT   Final   Special Requests BOTTLES DRAWN AEROBIC ONLY 5CC   Final   Culture  Setup Time 01/18/2013 18:48   Final   Culture     Final   Value: STAPHYLOCOCCUS SPECIES (COAGULASE NEGATIVE)     Note: THE SIGNIFICANCE OF ISOLATING THIS ORGANISM FROM A SINGLE SET OF BLOOD CULTURES WHEN MULTIPLE SETS ARE DRAWN IS UNCERTAIN. PLEASE NOTIFY THE MICROBIOLOGY DEPARTMENT WITHIN ONE WEEK IF SPECIATION AND SENSITIVITIES ARE REQUIRED.     Note: Gram Stain Report Called to,Read Back By and Verified With: Asencion Gowda RN on 01/20/13 at 00:55 by Christie Nottingham   Report Status 01/21/2013 FINAL   Final  MRSA PCR SCREENING     Status: None   Collection Time    01/18/13  4:24 PM      Result Value Range Status   MRSA by PCR NEGATIVE  NEGATIVE Final   Comment:            The GeneXpert MRSA Assay (FDA     approved for NASAL specimens     only), is one component of a     comprehensive MRSA colonization     surveillance program. It is not     intended to diagnose MRSA     infection nor to guide or     monitor treatment for     MRSA infections.      Studies: Dg Chest Port 1 View  01/24/2013   *RADIOLOGY REPORT*  Clinical Data: Shortness of breath.  Hypertension.  History of pulmonary embolus.  PORTABLE CHEST - 1 VIEW  Comparison: 01/23/2013  Findings: Right PICC line is stable in position.  Shallow inspiration.  Mild cardiac enlargement with pulmonary vascular congestion.  Mild interstitial edema.  No developing consolidation. Atelectasis in the left lung base.  No pneumothorax.  Calcification of the aorta.  IMPRESSION: Stable appearance of the chest since previous study.  Continued cardiac enlargement pulmonary vascular congestion with mild edema. Atelectasis left lung base.   Original Report Authenticated By: Burman Nieves, M.D.    Scheduled Meds: . furosemide  20 mg Intravenous BID  . metoprolol  5 mg Intravenous Q6H  . neomycin-bacitracin-polymyxin   Topical BID  . pantoprazole (PROTONIX) IV  40 mg Intravenous Q24H  . sodium chloride  3 mL Intravenous Q12H   Continuous Infusions:   Principal Problem:   Encephalopathy acute Active Problems:   Cellulitis   Renal failure (ARF), acute on chronic   Hypotension   History of pulmonary embolism   HTN (hypertension)   Anemia   Pure hypercholesterolemia   Chronic kidney disease, unspecified   Hypothermia   UTI (lower urinary tract infection)   Dehydration   Bradycardia   Solitary kidney   Sepsis   Cellulitis of leg   Supratherapeutic INR   Volume overload    Time spent:    CHIU, STEPHEN K  [facility] Pager [phone]. If 7PM-7AM, please contact night-coverage at [email or web address], password [facility] 01/26/2013, 12:26 PM  LOS: 8 days

## 2013-01-27 NOTE — Progress Notes (Signed)
Patient [name] [name]      DOB: 06-30-1924      UJW:[id number]   Palliative Medicine Team at [facility] Progress Note    Subjective:  [name] has been more awake today.  She has required increasing doses of pain medication. Family is at the bedside.  Heart rate remains tachycardic.    Filed Vitals:   01/27/13 0607  BP: 97/62  Pulse: 139  Temp: 98.9 F (37.2 C)  Resp: 18   Physical exam:  General: minimal distress complaining of right arm pain PERRL, dry mm Chest decreased without rhonchi rales, or wheezes CVS: tachy and irregular Abd: obese, soft, no grimace on palpation Ext: legs are wrapped no mottling, left arm warm and red Neuro: altered mental status ,although she is opening her eyes more today   Assessment and plan: 79 yr old white female with pseudomonas bacteremia and persistent lower extremity cellulitis.  Family has elected to pursue comfort care.  1.  DNR/ DNI  2.  Pain: continue prn medication.  3.  Prognosis: poor , likely days as she is not eating  4.  Disposition:  Will discuss with family residential hospice.  Total time 15 min  Gregoria Selvy L. Ladona Ridgel, MD MBA The Palliative Medicine Team at [facility] Phone: [phone] Pager: [phone]

## 2013-01-27 NOTE — Progress Notes (Signed)
[facility] PROGRESS NOTE  [name]:[id number] DOB: March 26, 1924 DOA: 01/18/2013 PCP: Gretel Acre, MD  Assessment/Plan: Encephalopathy acute:  -Secondary to sepsis and UTI plus dehydration and volume overload.  -Stopped abx secondary to comfort measures  Cellulitis:  -for now, dressing changes per WOC  Renal failure (ARF), acute on chronic:  -no more bloodwork secondary to comfort care  Hypotension:  - on iv lopressor  History of pulmonary embolism:  -INR was supertherapeutic, since d/c'd  -no plans to resume secondary to comfort care  HTN (hypertension):  -reportedly hypotensive. - Consider holding bblocker and giving PRN tachycardia/RVR Anemia  -no further bloodwork as above .  UTI (lower urinary tract infection):  - Secondary to resistant Pseudomonas  - Abx stopped secondary to comfort measures.  Bradycardia:  -had recurrent atrial fibrillation with a rapid rate after admit so IV Lopressor initiated.  End of Life:  - Does not want feeding tube  - Greatly appreciate input from Palliative Care  - Comfort measures only. Stop abx and stop bloodwork  Code Status: DNR Family Communication: Pt and family in room (indicate person spoken with, relationship, and if by phone, the number) Disposition Plan: Comfort care  Consultants:  Palliative Care  WOC  Procedures: 2-D echocardiogram - EF 50-55%, done 01/22/13  Antibiotics: IV vancomycin and Cipro: 7/4-7/8  IV aztreonam on 7/5>>>01/26/13  HPI/Subjective: No acute events overnight  Objective: Filed Vitals:   01/26/13 0909 01/26/13 1041 01/27/13 0602 01/27/13 0607  BP:  95/48  97/62  Pulse:  146  139  Temp:  98.8 F (37.1 C)  98.9 F (37.2 C)  TempSrc:  Axillary  Oral  Resp:  18  18  Height:      Weight: 109 kg (240 lb 4.8 oz)  109 kg (240 lb 4.8 oz)   SpO2:  99%  99%   No intake or output data in the 24 hours ending 01/27/13 1051 Filed Weights   01/26/13 0500 01/26/13 0909 01/27/13 0602   Weight: 109.453 kg (241 lb 4.8 oz) 109 kg (240 lb 4.8 oz) 109 kg (240 lb 4.8 oz)    Exam:   General:  Awake, in nad  Cardiovascular: irregular, s1, s2  Respiratory: normal resp effort, no wheezing  Abdomen: soft, nondistended   Musculoskeletal: perfused, no clubbing   Data Reviewed: Basic Metabolic Panel:  Recent Labs Lab 01/22/13 0530 01/25/13 0555 01/26/13 0500  NA 147* 152* 153*  K 4.9 4.0 3.5  CL 119* 119* 120*  CO2 17* 18* 21  GLUCOSE 108* 82 92  BUN 64* 66* 65*  CREATININE 2.09* 2.00* 2.05*  CALCIUM 9.7 9.6 9.2   Liver Function Tests:  Recent Labs Lab 01/22/13 0530 01/25/13 0555 01/26/13 0500  AST 14 16 14   ALT 22 17 15   ALKPHOS 67 67 56  BILITOT 0.2* 0.3 0.4  PROT 5.6* 5.4* 4.9*  ALBUMIN 2.3* 2.0* 1.8*   No results found for this basename: LIPASE, AMYLASE,  in the last 168 hours No results found for this basename: AMMONIA,  in the last 168 hours CBC:  Recent Labs Lab 01/22/13 0530 01/25/13 0555 01/26/13 0500  WBC 8.5 12.7* 13.4*  NEUTROABS  --  11.0* 11.6*  HGB 8.6* 8.8* 8.2*  HCT 27.5* 27.1* 25.1*  MCV 96.5 95.4 94.4  PLT 167 171 176   Cardiac Enzymes: No results found for this basename: CKTOTAL, CKMB, CKMBINDEX, TROPONINI,  in the last 168 hours BNP (last 3 results)  Recent Labs  01/22/13 0530  PROBNP  9436.0*   CBG: No results found for this basename: GLUCAP,  in the last 168 hours  Recent Results (from the past 240 hour(s))  URINE CULTURE     Status: None   Collection Time    01/18/13 11:48 AM      Result Value Range Status   Specimen Description URINE, RANDOM   Final   Special Requests NONE   Final   Culture  Setup Time 01/18/2013 19:13   Final   Colony Count >=100,000 COLONIES/ML   Final   Culture     Final   Value: Multiple bacterial morphotypes present, none predominant. Suggest appropriate recollection if clinically indicated.   Report Status 01/20/2013 FINAL   Final  CULTURE, BLOOD (ROUTINE X 2)     Status: None    Collection Time    01/18/13 12:15 PM      Result Value Range Status   Specimen Description BLOOD ARM LEFT   Final   Special Requests BOTTLES DRAWN AEROBIC AND ANAEROBIC 10CC   Final   Culture  Setup Time 01/18/2013 18:48   Final   Culture     Final   Value: PSEUDOMONAS AERUGINOSA     Note: AZTREONAM SENSITIVE 2ug/mL     Note: Gram Stain Report Called to,Read Back By and Verified With: WILLIAM RICE 01/19/13 @ 9:23PM BY RUSCOE A.   Report Status 01/24/2013 FINAL   Final   Organism ID, Bacteria PSEUDOMONAS AERUGINOSA   Final  CULTURE, BLOOD (ROUTINE X 2)     Status: None   Collection Time    01/18/13 12:20 PM      Result Value Range Status   Specimen Description BLOOD ARM RIGHT   Final   Special Requests BOTTLES DRAWN AEROBIC ONLY 5CC   Final   Culture  Setup Time 01/18/2013 18:48   Final   Culture     Final   Value: STAPHYLOCOCCUS SPECIES (COAGULASE NEGATIVE)     Note: THE SIGNIFICANCE OF ISOLATING THIS ORGANISM FROM A SINGLE SET OF BLOOD CULTURES WHEN MULTIPLE SETS ARE DRAWN IS UNCERTAIN. PLEASE NOTIFY THE MICROBIOLOGY DEPARTMENT WITHIN ONE WEEK IF SPECIATION AND SENSITIVITIES ARE REQUIRED.     Note: Gram Stain Report Called to,Read Back By and Verified With: Asencion Gowda RN on 01/20/13 at 00:55 by Christie Nottingham   Report Status 01/21/2013 FINAL   Final  MRSA PCR SCREENING     Status: None   Collection Time    01/18/13  4:24 PM      Result Value Range Status   MRSA by PCR NEGATIVE  NEGATIVE Final   Comment:            The GeneXpert MRSA Assay (FDA     approved for NASAL specimens     only), is one component of a     comprehensive MRSA colonization     surveillance program. It is not     intended to diagnose MRSA     infection nor to guide or     monitor treatment for     MRSA infections.     Studies: No results found.  Scheduled Meds: . furosemide  20 mg Intravenous BID  . metoprolol  5 mg Intravenous Q6H  . pantoprazole (PROTONIX) IV  40 mg Intravenous Q24H  . sodium  chloride  3 mL Intravenous Q12H   Continuous Infusions:   Principal Problem:   Encephalopathy acute Active Problems:   Cellulitis   Renal failure (ARF), acute on chronic   Hypotension  History of pulmonary embolism   HTN (hypertension)   Anemia   Pure hypercholesterolemia   Chronic kidney disease, unspecified   Hypothermia   UTI (lower urinary tract infection)   Dehydration   Bradycardia   Solitary kidney   Sepsis   Cellulitis of leg   Supratherapeutic INR   Volume overload    Time spent:    Siegfried Vieth K  [facility] Pager [phone]. If 7PM-7AM, please contact night-coverage at [email or web address], password [facility] 01/27/2013, 10:51 AM  LOS: 9 days

## 2013-01-28 MED ORDER — ONDANSETRON HCL 4 MG/2ML IJ SOLN: 4.0000 mg | Freq: Four times a day (QID) | INTRAMUSCULAR | Status: AC | PRN

## 2013-01-28 MED ORDER — ATROPINE SULFATE 1 % OP SOLN: 4.0000 [drp] | OPHTHALMIC | Status: AC | PRN

## 2013-01-28 MED ORDER — METOPROLOL TARTRATE 1 MG/ML IV SOLN: 5.0000 mg | Freq: Four times a day (QID) | INTRAVENOUS | Status: AC

## 2013-01-28 MED ORDER — ACETAMINOPHEN 650 MG RE SUPP: 650.0000 mg | Freq: Four times a day (QID) | RECTAL | Status: AC | PRN

## 2013-01-28 MED ORDER — FUROSEMIDE 10 MG/ML IJ SOLN: 20.0000 mg | Freq: Two times a day (BID) | INTRAMUSCULAR | Status: AC

## 2013-01-28 MED ORDER — DIPHENHYDRAMINE HCL 50 MG/ML IJ SOLN: 12.5000 mg | Freq: Four times a day (QID) | INTRAMUSCULAR | Status: AC | PRN

## 2013-01-28 MED ORDER — LORAZEPAM 2 MG/ML IJ SOLN: 0.5000 mg | INTRAMUSCULAR | Status: AC | PRN

## 2013-01-28 MED ORDER — MORPHINE SULFATE 2 MG/ML IJ SOLN: 1.0000 mg | INTRAMUSCULAR | Status: AC | PRN

## 2013-01-28 NOTE — Discharge Summary (Addendum)
Physician Discharge Summary  AMIAYAH GIEBEL WUJ:[id number] DOB: 06/18/1924 DOA: 01/18/2013  PCP: Gretel Acre, MD  Admit date: 01/18/2013 Discharge date: 01/29/2013  Time spent: 25 minutes  Recommendations for Outpatient Follow-up:   1) Wound care to bilateral LE's:  Cleanse daily with NS, pat gently dry. Cover with Arneta Cliche Ag+ [place] [phone]) and follow instructions for use that follow:   Measure and cut length of InterDry Ag+ to cover areas of skin breakdown. InterDry Ag+ fabric is applied in a single layer; do not wrap around LEs tightly, but instead lie across LEs to allow for wicking to occur and antimicrobial effect to take place. May remove to bathe; dry area thoroughly and then wrap affected areas again  Do not apply any creams or ointments when using InterDry Ag+ DO NOT THROW AWAY FOR 5 DAYS unless soiled with stool DO NOT WASH product, this will inactivate the silver in the material  New sheet of Interdry Ag+ should be applied after 5 days of use if patient continues to have skin breakdown  2) PICC care per Hospice  Discharge Diagnoses:  Principal Problem:   Encephalopathy acute Active Problems:   Cellulitis   Renal failure (ARF), acute on chronic   Hypotension   History of pulmonary embolism   HTN (hypertension)   Anemia   Pure hypercholesterolemia   Chronic kidney disease, unspecified   Hypothermia   UTI (lower urinary tract infection)   Dehydration   Bradycardia   Solitary kidney   Sepsis   Cellulitis of leg   Supratherapeutic INR   Volume overload   Discharge Condition: Stable  Diet recommendation: NPO  Filed Weights   01/27/13 0602 01/28/13 0500 01/29/13 0500  Weight: 109 kg (240 lb 4.8 oz) 104.4 kg (230 lb 2.6 oz) 102.6 kg (226 lb 3.1 oz)    History of present illness:  [name] is a 79 y.o. female is a resident at the skilled nursing facility at [facility] with a history of hypertension, chronic lower extremity cellulitis with Unna  boots, history of PE/DVT on chronic anticoagulation, solitary kidney, chronic kidney disease who presents to the ED with progressive worsening level of consciousness and unresponsiveness.  Patient minimally responsive to noxious stimuli and a such history was obtained from the patient's daughter and ED physician.  Per daughter patient was discharged from the hospital in the middle of May and sent to [facility]. Over the past 2 weeks prior to admission patient has been declining remarkably in terms of her cognition. One week prior to admission patient's daughter stated that patient was not lucid and had worsening mental status. Patient's daughter stated that she was called by the nursing home facility due to the mother being unresponsive with bradycardia and hypotension. EMS was called and patient was subsequently brought to the ED. In the ED patient was noted to be unresponsive, hypothermic, bradycardic, hypotensive and urinalysis consistent with a UTI. Basic metabolic profile obtained did show an elevated BUN and creatinine otherwise was within normal limits. Point-of-care troponin was negative. Lactic acid level is 1.0. Pro calcitonin level is 0.11. CBC obtained had a white count of 13.9, hemoglobin of 9.2 otherwise was within normal limits. Chest x-ray obtained showed low as 3 volumes with left greater than right basilar opacities. Patient was given IV vancomycin, IV aztreonam, IV Flagyl, IV ciprofloxacin.  Will were called to admit the patient for further evaluation and management.   Hospital Course:  Encephalopathy acute:  -Secondary to sepsis  and UTI plus dehydration and volume overload.  -The patient was initially N.p.o. until more alert and able to check swallow eval  - She was continued on aztreonam with culture demonstrating that the organism is sensitive to that antibiotic. - However, after one week of treatment, there seemed to be little, if any, clinical  improvement. - Hospice/Palliative Care was consulted and per family and patient wishes, the patient was continued with Comfort Measures only.  Cellulitis:  -Unna boots were removed with no signs of cellulitis  -WOC RN was consulted with wound care recs noted above -At this time her family would like the Unna boots not to be replaced  Renal failure (ARF), acute on chronic:  -renal function remained stable.  History of pulmonary embolism:  - Pharmacy was consulted for coumadin dosing - The patient's INR was noted to become supertherapeutic and rising  - This has been since held Anemia  -hgb stable  Hypothermia:  - Secondary to sepsis. - Resolved   Sepsis:  - Secondary to Pseudomonas from UTI.  Volume overload:  -Was given a course of IV Lasix  - Echo with EF 50-55%   Procedures: 2-D echocardiogram - EF 50-55%, done 01/22/13  Consultations: Palliative Care  WOC  Discharge Exam: Filed Vitals:   01/29/13 9604 01/29/13 0614 01/29/13 1016 01/29/13 1100  BP: 144/34 144/34 134/46   Pulse: 69 69 69   Temp: 97.3 F (36.3 C)   97.3 F (36.3 C)  TempSrc: Oral   Axillary  Resp: 18  20   Height:      Weight:      SpO2: 99%  99%     General: Lethargic, in nad Cardiovascular: perfused Respiratory: normal resp effort, no audible wheezing  Discharge Instructions     Medication List    STOP taking these medications       acetaminophen 500 MG tablet  Commonly known as:  TYLENOL  Replaced by:  acetaminophen 650 MG suppository     Decubi-Vite Caps     furosemide 20 MG tablet  Commonly known as:  LASIX  Replaced by:  furosemide 10 MG/ML injection     LORazepam 0.5 MG tablet  Commonly known as:  ATIVAN  Replaced by:  LORazepam 2 MG/ML injection     metoprolol succinate 25 MG 24 hr tablet  Commonly known as:  TOPROL-XL     sertraline 50 MG tablet  Commonly known as:  ZOLOFT     TUMS 500 MG chewable tablet  Generic drug:  calcium carbonate     warfarin 1 MG tablet   Commonly known as:  COUMADIN      TAKE these medications       acetaminophen 650 MG suppository  Commonly known as:  TYLENOL  Place 1 suppository (650 mg total) rectally every 6 (six) hours as needed.     atropine 1 % ophthalmic solution  Place 4 drops under the tongue every 4 (four) hours as needed (terminal secretions).     diphenhydrAMINE 50 MG/ML injection  Commonly known as:  BENADRYL  Inject 0.25 mLs (12.5 mg total) into the vein every 6 (six) hours as needed for itching.     furosemide 10 MG/ML injection  Commonly known as:  LASIX  Inject 2 mLs (20 mg total) into the vein 2 (two) times daily.     LORazepam 2 MG/ML injection  Commonly known as:  ATIVAN  Inject 0.25 mLs (0.5 mg total) into the vein every 4 (four) hours as needed  for anxiety.     metoprolol 1 MG/ML injection  Commonly known as:  LOPRESSOR  Inject 5 mLs (5 mg total) into the vein every 6 (six) hours.     morphine 2 MG/ML injection  Inject 0.5-1 mLs (1-2 mg total) into the vein every 2 (two) hours as needed.     ondansetron 4 MG/2ML Soln injection  Commonly known as:  ZOFRAN  Inject 2 mLs (4 mg total) into the vein every 6 (six) hours as needed for nausea.       Allergies  Allergen Reactions  . Penicillins Anaphylaxis  . Codeine     Verified with daughter- N/V "violently ill"   Follow-up Information   Follow up with NNODI, ADAKU, MD. (As needed)    Contact information:   [address] [place] [address] [phone]        The results of significant diagnostics from this hospitalization (including imaging, microbiology, ancillary and laboratory) are listed below for reference.    Significant Diagnostic Studies: Ct Head Wo Contrast  01/18/2013   *RADIOLOGY REPORT*  Clinical Data: Altered mental status.  CT HEAD WITHOUT CONTRAST  Technique:  Contiguous axial images were obtained from the base of the skull through the vertex without contrast.  Comparison: 11/26/2012  Findings: There is  no acute intracranial hemorrhage, infarction, or mass lesion.  Diffuse cerebral cortical atrophy.  No acute osseous abnormality.  No change since the prior exam.  IMPRESSION: No acute abnormality.  Diffuse atrophy.   Original Report Authenticated By: Francene Boyers, M.D.   Ir Fluoro Guide Cv Line Right  01/23/2013   *RADIOLOGY REPORT*  Clinical history:Malpositioned right arm PICC line.  PROCEDURE(S): EXCHANGE OF RIGHT ARM PICC LINE WITH FLUOROSCOPY  Physician: Rachelle Hora. Henn, MD  Medications:None  Moderate sedation time:None  Fluoroscopy time: 2 minutes  Procedure:Informed consent was obtained for a PICC line exchange. The right upper arm was prepped and draped in a sterile fashion. Maximal barrier sterile technique was utilized including caps, mask, sterile gowns, sterile gloves, sterile drape, hand hygiene and skin antiseptic.  The catheter was cut and removed over a wire. The wire took a very tortuous course in the shoulder.  Eventually, a 5-French Kumpe catheter and glidewire were advanced into the SVC. A single lumen Power PICC line was cut to 44 cm and advanced over the Glidewire.  Catheter was placed at the junction of superior vena cava and right atrium.  Catheter flushed and aspirated well. Skin was anesthetized with lidocaine.  Catheter sutured to the skin.  Findings:The old catheter was coiled in the right subclavianvein region.  New catheter tip at the SVC/RA junction.  Complications: None  Impression:Successful exchange of the right arm PICC line.   Original Report Authenticated By: Richarda Overlie, M.D.   Dg Chest Port 1 View  01/24/2013   *RADIOLOGY REPORT*  Clinical Data: Shortness of breath.  Hypertension.  History of pulmonary embolus.  PORTABLE CHEST - 1 VIEW  Comparison: 01/23/2013  Findings: Right PICC line is stable in position.  Shallow inspiration.  Mild cardiac enlargement with pulmonary vascular congestion.  Mild interstitial edema.  No developing consolidation. Atelectasis in the left lung  base.  No pneumothorax.  Calcification of the aorta.  IMPRESSION: Stable appearance of the chest since previous study.  Continued cardiac enlargement pulmonary vascular congestion with mild edema. Atelectasis left lung base.   Original Report Authenticated By: Burman Nieves, M.D.   Dg Chest Port 1 View  01/23/2013   *RADIOLOGY REPORT*  Clinical Data: The PICC line placement.  PORTABLE CHEST - 1 VIEW  Comparison: 01/23/2013  Findings: Right PICC line is in place with the tip in the SVC. Mild cardiomegaly.  Bibasilar atelectasis and vascular congestion. Decreasing lung volumes since prior study.  IMPRESSION: Right PICC line pulled back with the tip now in the SVC.  Shallow inspiration.  Vascular congestion and bibasilar atelectasis.   Original Report Authenticated By: Charlett Nose, M.D.   Dg Chest Port 1 View  01/23/2013   *RADIOLOGY REPORT*  Clinical Data: PICC line placement  PORTABLE CHEST - 1 VIEW  Comparison: Portable exam 2054 hours compared to 01/22/2013  Findings: Tip of right arm PICC line projects over right atrium. Enlargement of cardiac silhouette with pulmonary vascular congestion. Calcified tortuous aorta. Bibasilar atelectasis. Bronchitic and question emphysematous changes. Question mild infiltrate right upper lobe. No gross pleural effusion or pneumothorax.  IMPRESSION: Tip of right arm PICC line projects over right atrium; recommend withdrawal 5 cm for positioning at cavoatrial junction. Enlargement of cardiac silhouette with pulmonary vascular congestion. Bronchitic question emphysematous changes with bibasilar atelectasis and questionable right upper lobe infiltrate.   Original Report Authenticated By: Ulyses Southward, M.D.   Dg Chest Port 1 View  01/22/2013   *RADIOLOGY REPORT*  Clinical Data: Confirm line placement.  PORTABLE CHEST - 1 VIEW  Comparison: 01/18/2013  Findings: Right central venous catheter which appears to be a PICC line is coiled in the axillary region.  The tip is projected over  the distal subclavian vein but location in the vein cannot be confirmed due to the course of the catheter. No pneumothorax. Cardiac enlargement with normal pulmonary vascularity. Interstitial fibrosis versus atelectasis in the lung bases. Calcified and tortuous aorta.  No blunting of costophrenic angles.  IMPRESSION: Right PICC line is coiled over the right axillary region.  Tip location cannot be confirmed.  Results were telephoned to [facility] on the IV team at 2203 hours on 01/22/2013.   Original Report Authenticated By: Burman Nieves, M.D.   Dg Chest Port 1 View  01/18/2013   *RADIOLOGY REPORT*  Clinical Data: Altered mental status  PORTABLE CHEST - 1 VIEW  Comparison: Prior chest x-ray 11/26/2012  Findings: Lower inspiratory volumes with new left greater than right basilar opacities.  There is pulmonary vascular congestion bordering on mild interstitial edema.  The patient is slightly rotated toward the left. Given the rotation, the cardiac and mediastinal contours appears similar compared to prior. Atherosclerosis noted in the transverse aorta.  IMPRESSION:  1.  Low inspiratory volumes with left greater than right basilar opacities which may reflect atelectasis, infiltrate or early asymmetric pulmonary edema. 2.  Increased pulmonary vascular congestion bordering on mild edema.   Original Report Authenticated By: Malachy Moan, M.D.    Microbiology: No results found for this or any previous visit (from the past 240 hour(s)).   Labs: Basic Metabolic Panel:  Recent Labs Lab 01/25/13 0555 01/26/13 0500  NA 152* 153*  K 4.0 3.5  CL 119* 120*  CO2 18* 21  GLUCOSE 82 92  BUN 66* 65*  CREATININE 2.00* 2.05*  CALCIUM 9.6 9.2   Liver Function Tests:  Recent Labs Lab 01/25/13 0555 01/26/13 0500  AST 16 14  ALT 17 15  ALKPHOS 67 56  BILITOT 0.3 0.4  PROT 5.4* 4.9*  ALBUMIN 2.0* 1.8*   No results found for this basename: LIPASE, AMYLASE,  in the last 168 hours No results found for  this basename: AMMONIA,  in the last 168  hours CBC:  Recent Labs Lab 01/25/13 0555 01/26/13 0500  WBC 12.7* 13.4*  NEUTROABS 11.0* 11.6*  HGB 8.8* 8.2*  HCT 27.1* 25.1*  MCV 95.4 94.4  PLT 171 176   Cardiac Enzymes: No results found for this basename: CKTOTAL, CKMB, CKMBINDEX, TROPONINI,  in the last 168 hours BNP: BNP (last 3 results)  Recent Labs  01/22/13 0530  PROBNP 9436.0*   CBG: No results found for this basename: GLUCAP,  in the last 168 hours     Signed:  CHIU, STEPHEN K  [facility] 01/29/2013, 12:04 PM

## 2013-01-28 NOTE — Progress Notes (Signed)
Nutrition Brief Note  Chart reviewed. Pt now transitioning to comfort care.  No further nutrition interventions warranted at this time.  Please re-consult as needed.   Merril Isakson Kowalski RD, LDN Pager #[phone] After Hours pager #[phone]    

## 2013-01-28 NOTE — Progress Notes (Signed)
[facility] PROGRESS NOTE  [name] WRU:[id number] DOB: 1924-07-18 DOA: 01/18/2013 PCP: Gretel Acre, MD  Assessment/Plan: Encephalopathy acute:  -Secondary to sepsis and UTI plus dehydration and volume overload.  -Stopped abx secondary to comfort measures  Cellulitis:  -for now, dressing changes per WOC  Renal failure (ARF), acute on chronic:  -no more bloodwork secondary to comfort care  Hypotension:  - on iv lopressor  History of pulmonary embolism:  -INR was supertherapeutic, since d/c'd  -no plans to resume secondary to comfort care  HTN (hypertension):  -reportedly hypotensive.  - Consider holding bblocker and giving PRN tachycardia/RVR  Anemia  -no further bloodwork as above .  UTI (lower urinary tract infection):  - Secondary to resistant Pseudomonas  - Abx stopped secondary to comfort measures.  Bradycardia:  -had recurrent atrial fibrillation with a rapid rate after admit so IV Lopressor initiated.  End of Life:  - Does not want feeding tube  - Greatly appreciate input from Palliative Care  - Comfort measures only. Stop abx and stop bloodwork   Code Status: DNR Family Communication: Pt in room (indicate person spoken with, relationship, and if by phone, the number) Disposition Plan: Pending   Consultants: Palliative Care   WOC   Procedures: 2-D echocardiogram - EF 50-55%, done 01/22/13   Antibiotics: IV vancomycin and Cipro: 7/4-7/8  IV aztreonam on 7/5>>>01/26/13   HPI/Subjective: No acute events noted overnight  Objective: Filed Vitals:   01/27/13 0607 01/28/13 0100 01/28/13 0500 01/28/13 0555  BP: 97/62 102/44  114/39  Pulse: 139 134  79  Temp: 98.9 F (37.2 C)   97.7 F (36.5 C)  TempSrc: Oral   Oral  Resp: 18   18  Height:      Weight:   104.4 kg (230 lb 2.6 oz)   SpO2: 99%   99%    Intake/Output Summary (Last 24 hours) at 01/28/13 1203 Last data filed at 01/28/13 0900  Gross per 24 hour  Intake      0 ml  Output    1850 ml  Net  -1850 ml   Filed Weights   01/26/13 0909 01/27/13 0602 01/28/13 0500  Weight: 109 kg (240 lb 4.8 oz) 109 kg (240 lb 4.8 oz) 104.4 kg (230 lb 2.6 oz)    Exam:   General:  Awake, in nad  Cardiovascular: perfused  Respiratory: normal resp effort, no audible wheezing  Abdomen: nondistended  Musculoskeletal: perfused, no clubbing   Data Reviewed: Basic Metabolic Panel:  Recent Labs Lab 01/22/13 0530 01/25/13 0555 01/26/13 0500  NA 147* 152* 153*  K 4.9 4.0 3.5  CL 119* 119* 120*  CO2 17* 18* 21  GLUCOSE 108* 82 92  BUN 64* 66* 65*  CREATININE 2.09* 2.00* 2.05*  CALCIUM 9.7 9.6 9.2   Liver Function Tests:  Recent Labs Lab 01/22/13 0530 01/25/13 0555 01/26/13 0500  AST 14 16 14   ALT 22 17 15   ALKPHOS 67 67 56  BILITOT 0.2* 0.3 0.4  PROT 5.6* 5.4* 4.9*  ALBUMIN 2.3* 2.0* 1.8*   No results found for this basename: LIPASE, AMYLASE,  in the last 168 hours No results found for this basename: AMMONIA,  in the last 168 hours CBC:  Recent Labs Lab 01/22/13 0530 01/25/13 0555 01/26/13 0500  WBC 8.5 12.7* 13.4*  NEUTROABS  --  11.0* 11.6*  HGB 8.6* 8.8* 8.2*  HCT 27.5* 27.1* 25.1*  MCV 96.5 95.4 94.4  PLT 167 171 176   Cardiac Enzymes: No  results found for this basename: CKTOTAL, CKMB, CKMBINDEX, TROPONINI,  in the last 168 hours BNP (last 3 results)  Recent Labs  01/22/13 0530  PROBNP 9436.0*   CBG: No results found for this basename: GLUCAP,  in the last 168 hours  Recent Results (from the past 240 hour(s))  CULTURE, BLOOD (ROUTINE X 2)     Status: None   Collection Time    01/18/13 12:15 PM      Result Value Range Status   Specimen Description BLOOD ARM LEFT   Final   Special Requests BOTTLES DRAWN AEROBIC AND ANAEROBIC 10CC   Final   Culture  Setup Time 01/18/2013 18:48   Final   Culture     Final   Value: PSEUDOMONAS AERUGINOSA     Note: AZTREONAM SENSITIVE 2ug/mL     Note: Gram Stain Report Called to,Read Back By and  Verified With: WILLIAM RICE 01/19/13 @ 9:23PM BY RUSCOE A.   Report Status 01/24/2013 FINAL   Final   Organism ID, Bacteria PSEUDOMONAS AERUGINOSA   Final  CULTURE, BLOOD (ROUTINE X 2)     Status: None   Collection Time    01/18/13 12:20 PM      Result Value Range Status   Specimen Description BLOOD ARM RIGHT   Final   Special Requests BOTTLES DRAWN AEROBIC ONLY 5CC   Final   Culture  Setup Time 01/18/2013 18:48   Final   Culture     Final   Value: STAPHYLOCOCCUS SPECIES (COAGULASE NEGATIVE)     Note: THE SIGNIFICANCE OF ISOLATING THIS ORGANISM FROM A SINGLE SET OF BLOOD CULTURES WHEN MULTIPLE SETS ARE DRAWN IS UNCERTAIN. PLEASE NOTIFY THE MICROBIOLOGY DEPARTMENT WITHIN ONE WEEK IF SPECIATION AND SENSITIVITIES ARE REQUIRED.     Note: Gram Stain Report Called to,Read Back By and Verified With: Asencion Gowda RN on 01/20/13 at 00:55 by Christie Nottingham   Report Status 01/21/2013 FINAL   Final  MRSA PCR SCREENING     Status: None   Collection Time    01/18/13  4:24 PM      Result Value Range Status   MRSA by PCR NEGATIVE  NEGATIVE Final   Comment:            The GeneXpert MRSA Assay (FDA     approved for NASAL specimens     only), is one component of a     comprehensive MRSA colonization     surveillance program. It is not     intended to diagnose MRSA     infection nor to guide or     monitor treatment for     MRSA infections.     Studies: No results found.  Scheduled Meds: . furosemide  20 mg Intravenous BID  . metoprolol  5 mg Intravenous Q6H  . pantoprazole (PROTONIX) IV  40 mg Intravenous Q24H  . sodium chloride  3 mL Intravenous Q12H   Continuous Infusions:   Principal Problem:   Encephalopathy acute Active Problems:   Cellulitis   Renal failure (ARF), acute on chronic   Hypotension   History of pulmonary embolism   HTN (hypertension)   Anemia   Pure hypercholesterolemia   Chronic kidney disease, unspecified   Hypothermia   UTI (lower urinary tract infection)    Dehydration   Bradycardia   Solitary kidney   Sepsis   Cellulitis of leg   Supratherapeutic INR   Volume overload    Time spent:    CHIU, STEPHEN K  [facility] Pager [phone]. If 7PM-7AM, please contact night-coverage at [email or web address], password [facility] 01/28/2013, 12:03 PM  LOS: 10 days

## 2013-01-28 NOTE — Progress Notes (Signed)
Attempted to turn patient but family refused, saying"  she seems to be more comfortable now ". Will continue to monitor.

## 2013-01-28 NOTE — Clinical Social Work Note (Signed)
Clinical Social Worker received handoff regarding patient information and plans for discharge today (07/14).  Per notes, patient was a resident at [facility] in rehab and all of sudden has a steady decline.  Upon arrival to the hospital, patient was actively being treated, however patient family has now opted for full comfort care measures.  Patient has not eaten in 10 days.  CSW spoke with palliative medicine MD who plans to further discuss plans with patient family this evening.  Patient daughter was agreeable to [facility], however no beds are available at this time.  Patient family would not like for patient to return to [place] with Hospice following and do not like the idea of going out of [place].    Clinical Social Worker explained the necessity for a discharge plan, however patient daughter states "one day at a time."  Patient family understands that if a disposition is not in the works they could run the risk of insurance no longer covering patient hospital stay.  CSW will follow up with patient family once palliative MD note is placed and further options can be explored - CM updated.  CSW available for support and to facilitate patient discharge needs once venue is determined.  Macario Golds, [place] [phone]

## 2013-01-29 MED ORDER — MORPHINE SULFATE (CONCENTRATE) 10 MG /0.5 ML PO SOLN: 5.0000 mg | ORAL | Status: AC

## 2013-01-29 MED ORDER — LORAZEPAM 2 MG/ML IJ SOLN
1.0000 mg | INTRAMUSCULAR | Status: DC | PRN
  Administered 2013-01-29: 1 mg via INTRAVENOUS
  Filled 2013-01-29: qty 1

## 2013-01-29 MED ORDER — MORPHINE SULFATE (CONCENTRATE) 10 MG /0.5 ML PO SOLN: 5.0000 mg | ORAL | Status: DC

## 2013-01-29 NOTE — Progress Notes (Signed)
Patient [name]      DOB: 06-13-1924      [id number]   Palliative Medicine Team at [facility] Progress Note    Subjective: Patient remains intermittently he agitated with pain mainly appears to be in her upper back and shoulders. Her daughter tells me that at home she had a predisposition to sitting up straight forward to alleviate her back discomfort. Over the course of the day she has been medicated and now appears to be relatively more comfortable. I visited the patient earlier and return to talk with her daughter [name]. [name] is open to transition to residential hospice if a bed is available and the patient is clinically stable. An air mattress overlay ordered.     Filed Vitals:   01/28/13 0036  BP:  114/39   Pulse:  79   Temp:  MAXIMUM TEMPERATURE 97.7   Resp:  18    Physical exam:   general: No acute distress at present earlier brow was furloughed and she was moving her shoulders Pupils remain equal round reactive to light extraocular muscles remained intact to this exam Chest is decreased with some basilar crackles Cardiovascular: Irregular Abdomen obese soft nondistended no grimace on palpation Extremities remain wrapped, upper left arm appears less red Neurologic the patient is intermittently arousable and did speak over the weekend but is not currently speaking    no labs    Assessment and plan: 79 year old white female admitted with persistent cellulitis of the lower extremities and sepsis. She was found to have Pseudomonas bacteremia.  The patient's family has elected comfort care and was anticipating a hospital death but she persists in being relatively stable. I was able to speak with her daughter [name] this evening she is open  transition to residential hospice and he is in as a bed is available and the patient is medically stable for movement.  1. DNR/DNI  2.  Pain:  Patient given total of 4 mg Morphine over the course of 24 hrs.  Will consider  scheduling meds if she has protracted pain.  3. Anxiety agitation continue when necessary Ativan  4. Continue to hold on dressing changes until clarification of type of dressing can be made. 5. Residential hospice referral   Prognosis likely still days Disposition attempt to transition to residential hospice home unless condition changes weren't seeing potential inpatient care.   Total time 35 minutes  Audrey Shackleford L. Ladona Ridgel, MD MBA The Palliative Medicine Team at [facility] Phone: [phone] Pager: [phone]

## 2013-01-29 NOTE — Progress Notes (Signed)
Pt legs are swollen and weeping , dsg was removed because of fully drainage with odor noted areas of bleeding on both legs , Dr. Ladona Ridgel in room and orders to  Apply new dsg with vaseline dsg and kerlix until wound consult changed as ordered

## 2013-01-29 NOTE — Clinical Social Work Note (Signed)
Clinical Social Worker facilitated patient discharge including contacting patient family and facility to confirm patient discharge plans.  Clinical information faxed to facility and family agreeable with plan.  CSW arranged ambulance transport via [facility] to [facility].  RN to call report prior to discharge.  Clinical Social Worker will sign off for now as social work intervention is no longer needed. Please consult us again if new need arises.  Jesse Caffie Sotto, LCSW [phone] 

## 2013-01-29 NOTE — Progress Notes (Signed)
[name] [name]      DOB: Jan 16, 1924      [id number]   Palliative Medicine Team at [facility] Progress Note    Subjective:  Audrey receiving bath.  Brow furrowed slight moaning. Medicated by RN.  Dressing on legs removed.  Bilateral macerated foul smelling wounds.  Plan for transition to residential hospice when bed available.  Spoke with Wound nurse recommended to use Xeroform change every 5 days with guaze reinforcements changed prn.  Discussed case with patients daugher and son in law.     Filed Vitals:   01/29/13 1100  BP:   Pulse:   Temp: 97.3 F (36.3 C)  Resp:    Physical exam: General: moderate distress, nonverbal but moaning , confused PERRL, EOMi, anicteric Chest decreased but clear CVS: irregualr Abd: obese Extremities : macerated and bleeding with purlulent material Neuro: non verbal and confused    Assessment and plan: 79 yr old with sepsis related to bilater lower extremity wounds and pseudomonas bacteremia.  Prognosis: days  1. DNR 2.  Wound care: xeroform dressing on 5 days reinforce with prn dressing changes using guaze 3.  Pain: schedule roxanol and use prn 4. Anxiety: prn ativan.  Total time 25 min  Ihor Meinzer L. Ladona Ridgel, MD MBA The Palliative Medicine Team at [facility] Phone: [phone] Pager: [phone]

## 2013-01-29 NOTE — Progress Notes (Signed)
Report was called to [facility] 

## 2013-02-15 DEATH — deceased

## 2013-04-11 NOTE — Telephone Encounter (Signed)
Patient daughter stated that patient is DECEASED and no longer needs an appointment.

## 2013-04-11 NOTE — Telephone Encounter (Signed)
I am not sure this was ever responded to. Please call and confirm whether she still wants to schedule an appointment here. I am OK with seeing her, but any of the doctors here could see her.

## 2013-07-30 NOTE — Progress Notes (Signed)
This encounter was created in error - please disregard.

## 2013-08-01 NOTE — Progress Notes (Signed)
This encounter was created in error - please disregard.

## 2014-05-12 IMAGING — CT CT HEAD W/O CM
1 series · 16 of 30 positions shown, 20 images · non-contrast
Comparison: 11/26/2012

CLINICAL DATA: Altered mental status.

CT HEAD WITHOUT CONTRAST
TECHNIQUE: Contiguous axial images were obtained from the base of
the skull through the vertex without contrast.

[Series 2: head 5.0 h30s · axial · 0.42mm/px · z∈[-242,-102]mm · 16 of 32 slices shown, 20 images]
[im 2/32  brain]
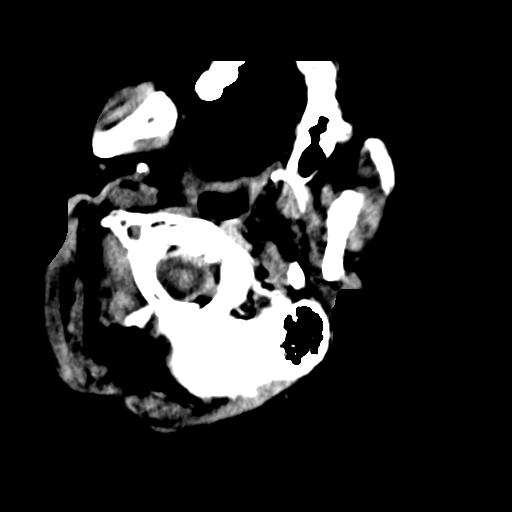
[im 2/32  bone]
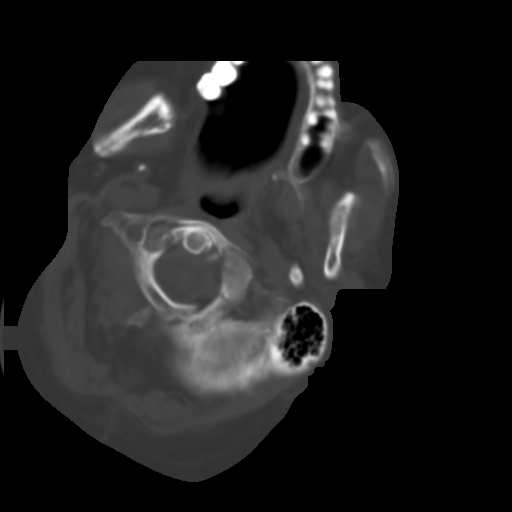
[im 4/32  brain]
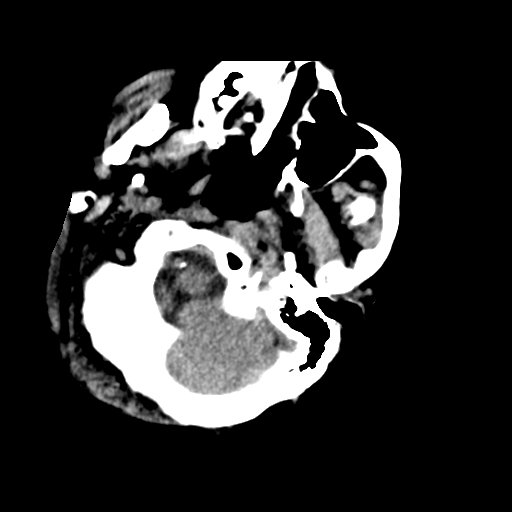
[im 6/32  brain]
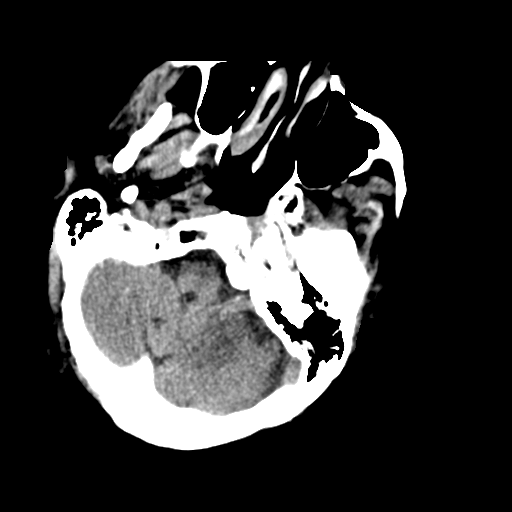
[im 8/32  brain]
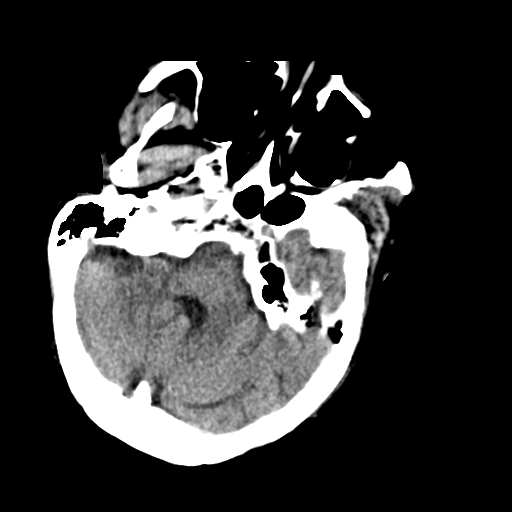
[im 9/32  brain]
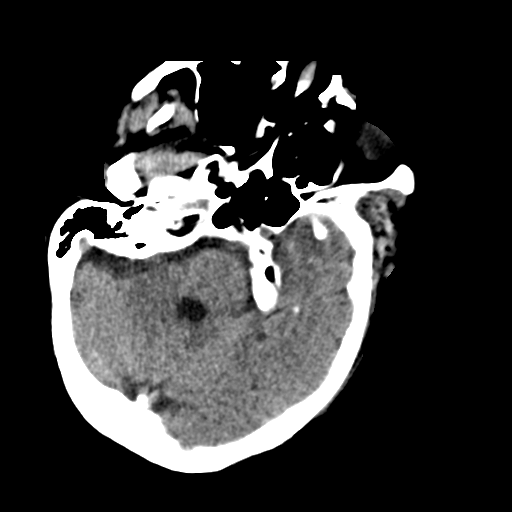
[im 9/32  bone]
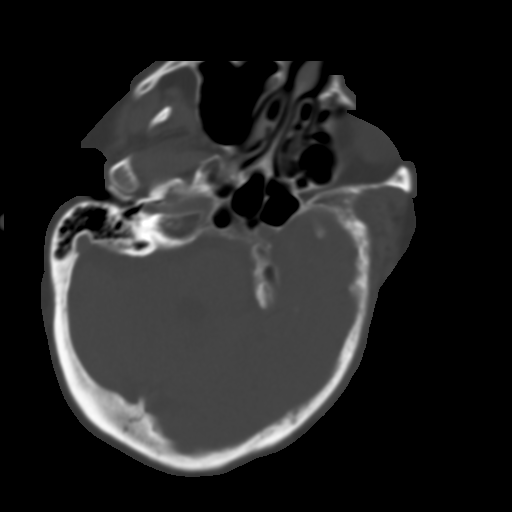
[im 11/32  brain]
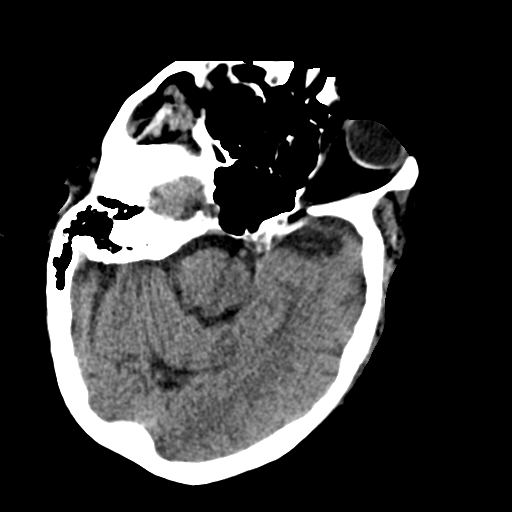
[im 13/32  brain]
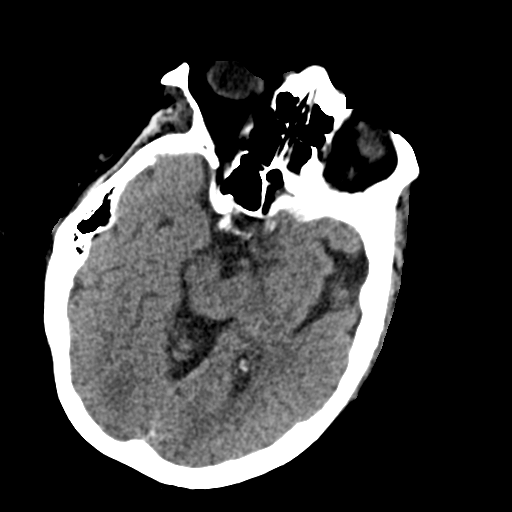
[im 15/32  brain]
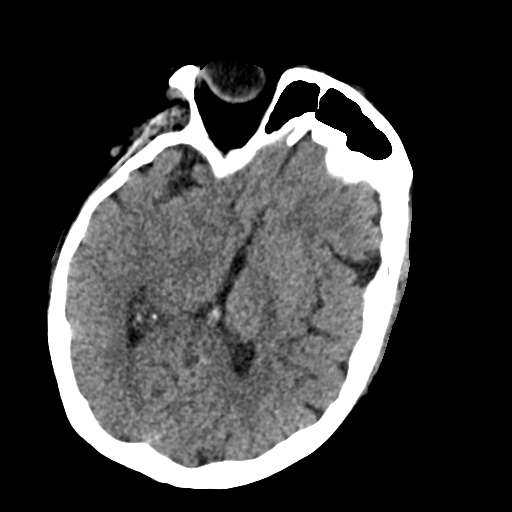
[im 17/32  brain]
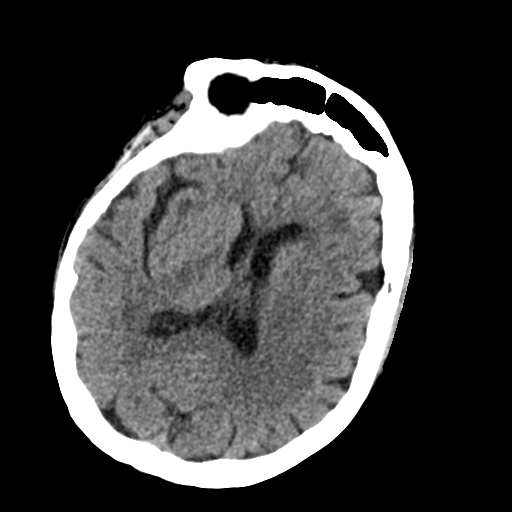
[im 17/32  bone]
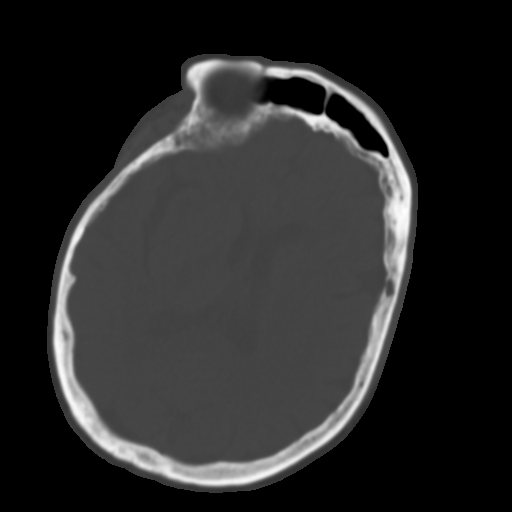
[im 19/32  brain]
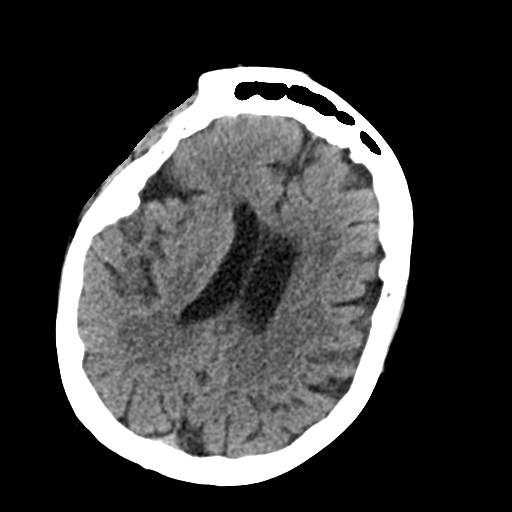
[im 21/32  brain]
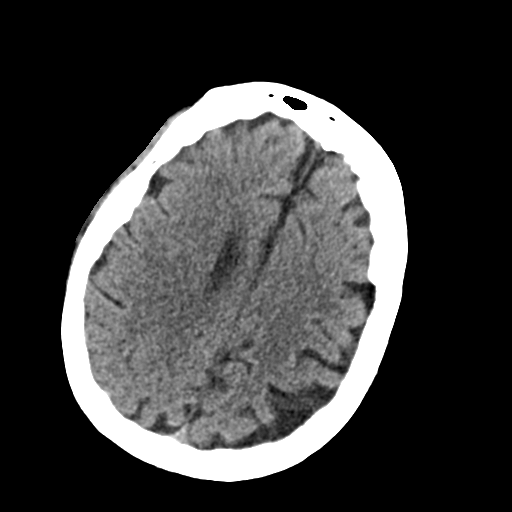
[im 23/32  brain]
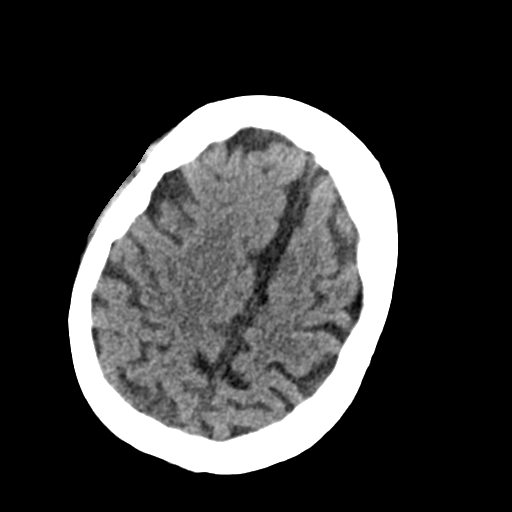
[im 24/32  brain]
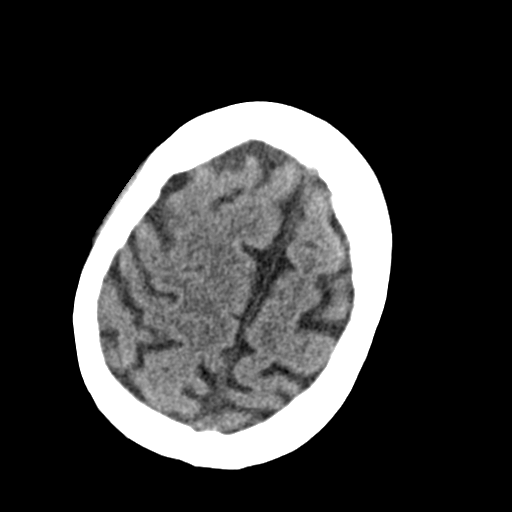
[im 24/32  bone]
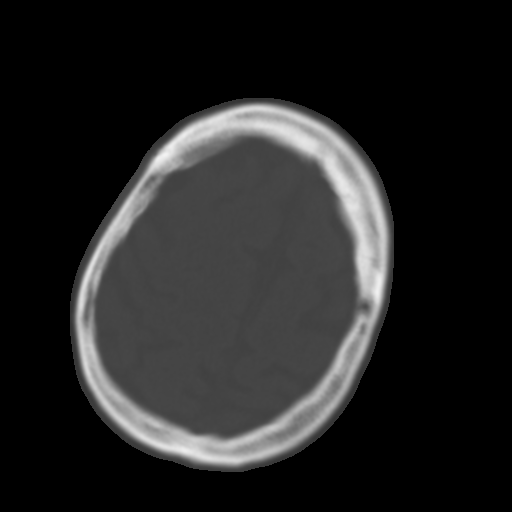
[im 26/32  brain]
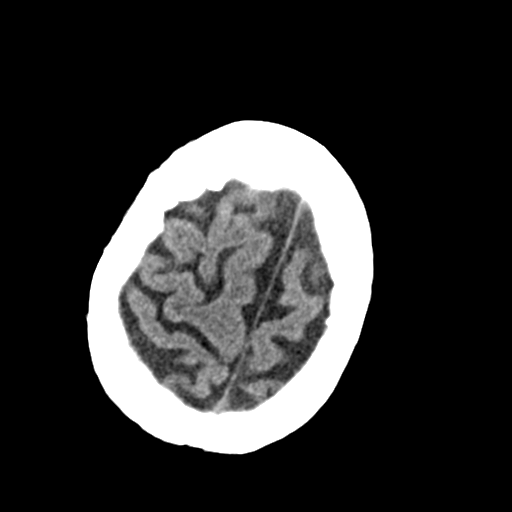
[im 28/32  brain]
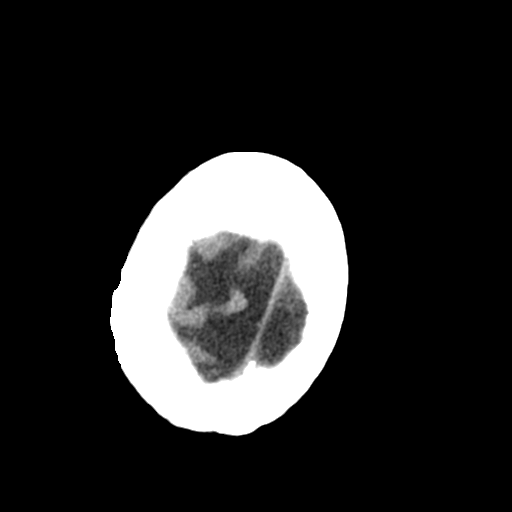
[im 30/32  brain]
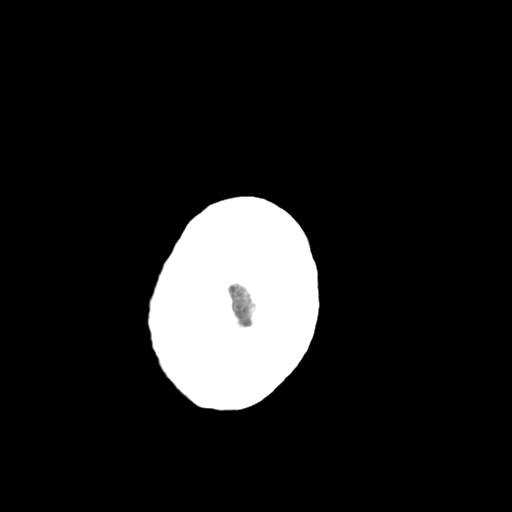

[16 of 30 positions shown; findings below may reference images not displayed]

FINDINGS: There is no acute intracranial hemorrhage, infarction, or
mass lesion.  Diffuse cerebral cortical atrophy.  No acute osseous
abnormality.  No change since the prior exam.
IMPRESSION: No acute abnormality.  Diffuse atrophy.

## 2014-05-16 IMAGING — CR DG CHEST 1V PORT
1 series · 1 of 1 positions shown · non-contrast
Comparison: 01/18/2013

CLINICAL DATA: Confirm line placement.

PORTABLE CHEST - 1 VIEW

[AP]
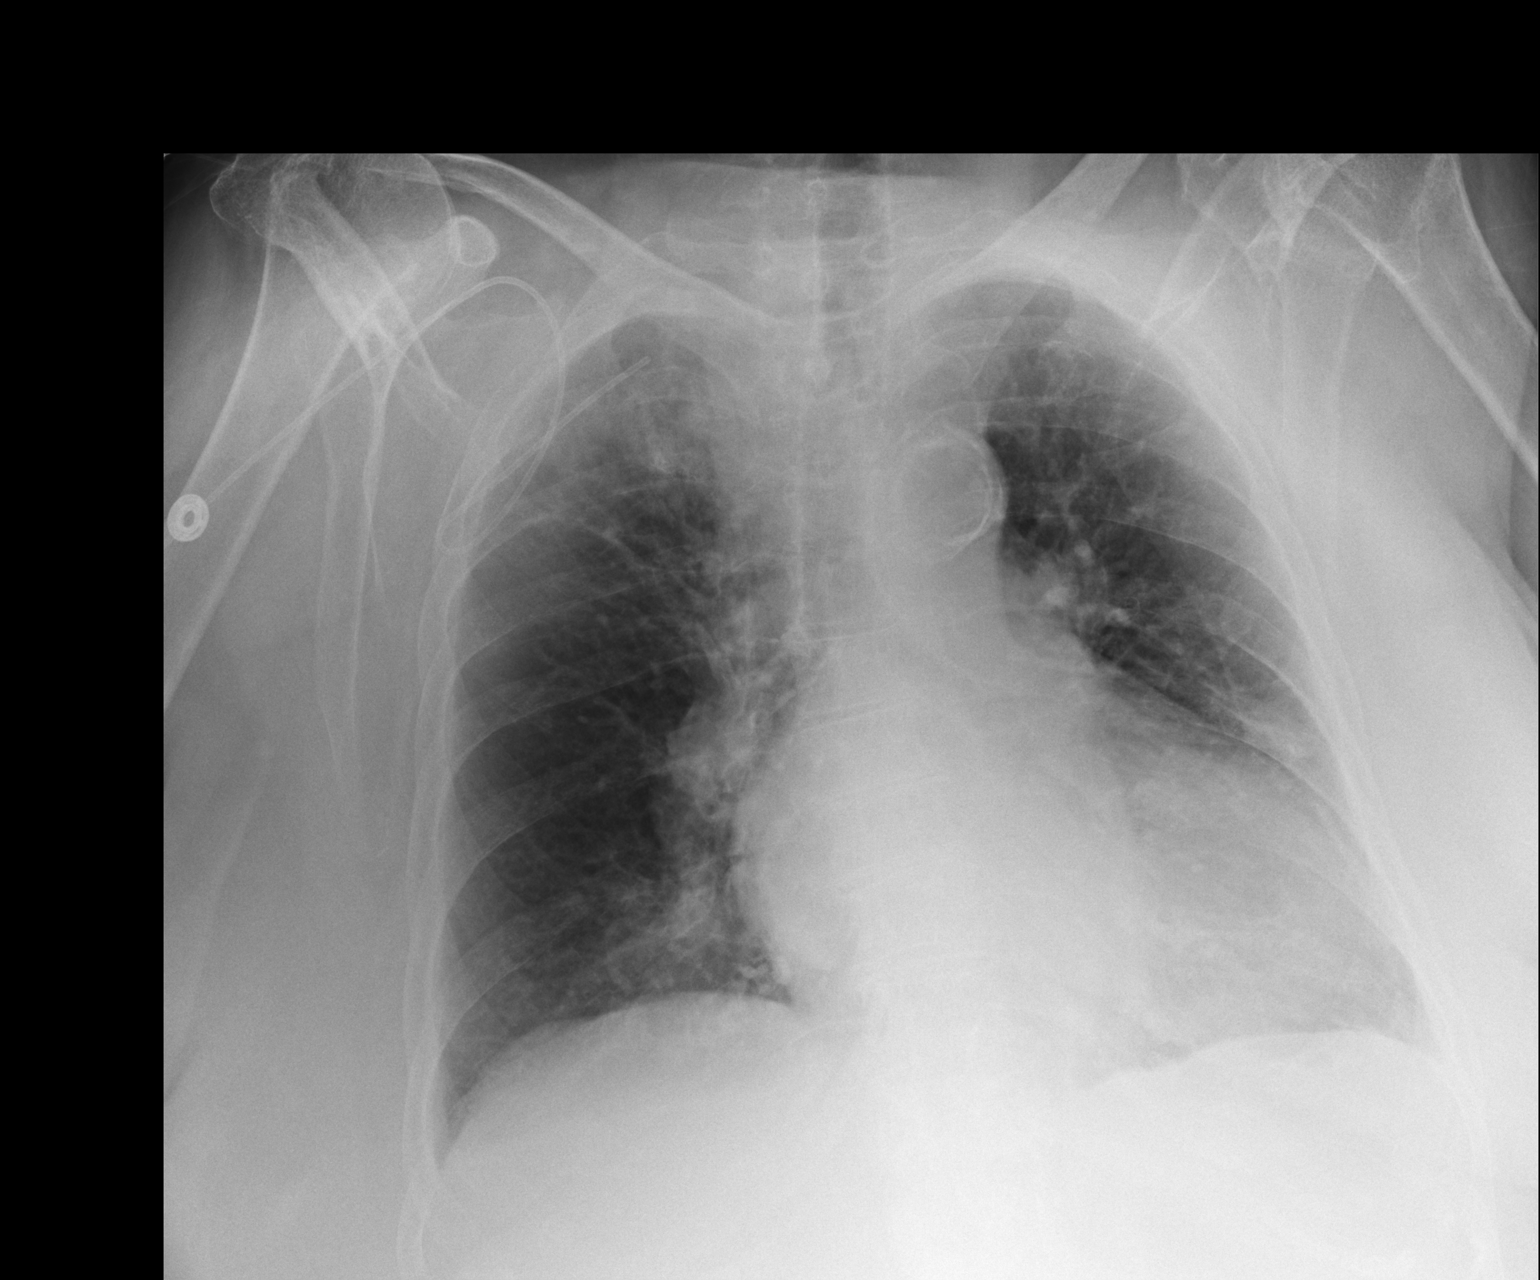

[1 of 1 positions shown; findings below may reference images not displayed]

FINDINGS: Right central venous catheter which appears to be a PICC
line is coiled in the axillary region.  The tip is projected over
the distal subclavian vein but location in the vein cannot be
confirmed due to the course of the catheter. No pneumothorax.
Cardiac enlargement with normal pulmonary vascularity.
Interstitial fibrosis versus atelectasis in the lung bases.
Calcified and tortuous aorta.  No blunting of costophrenic angles.
IMPRESSION: Right PICC line is coiled over the right axillary region.  Tip
location cannot be confirmed.

Results were telephoned to Parla on the IV team at 4400 hours on
01/22/2013.

## 2014-05-17 IMAGING — CR DG CHEST 1V PORT
1 series · 1 of 1 positions shown · non-contrast
Comparison: Portable exam 3274 hours compared to 01/22/2013

CLINICAL DATA: PICC line placement

PORTABLE CHEST - 1 VIEW

[AP]
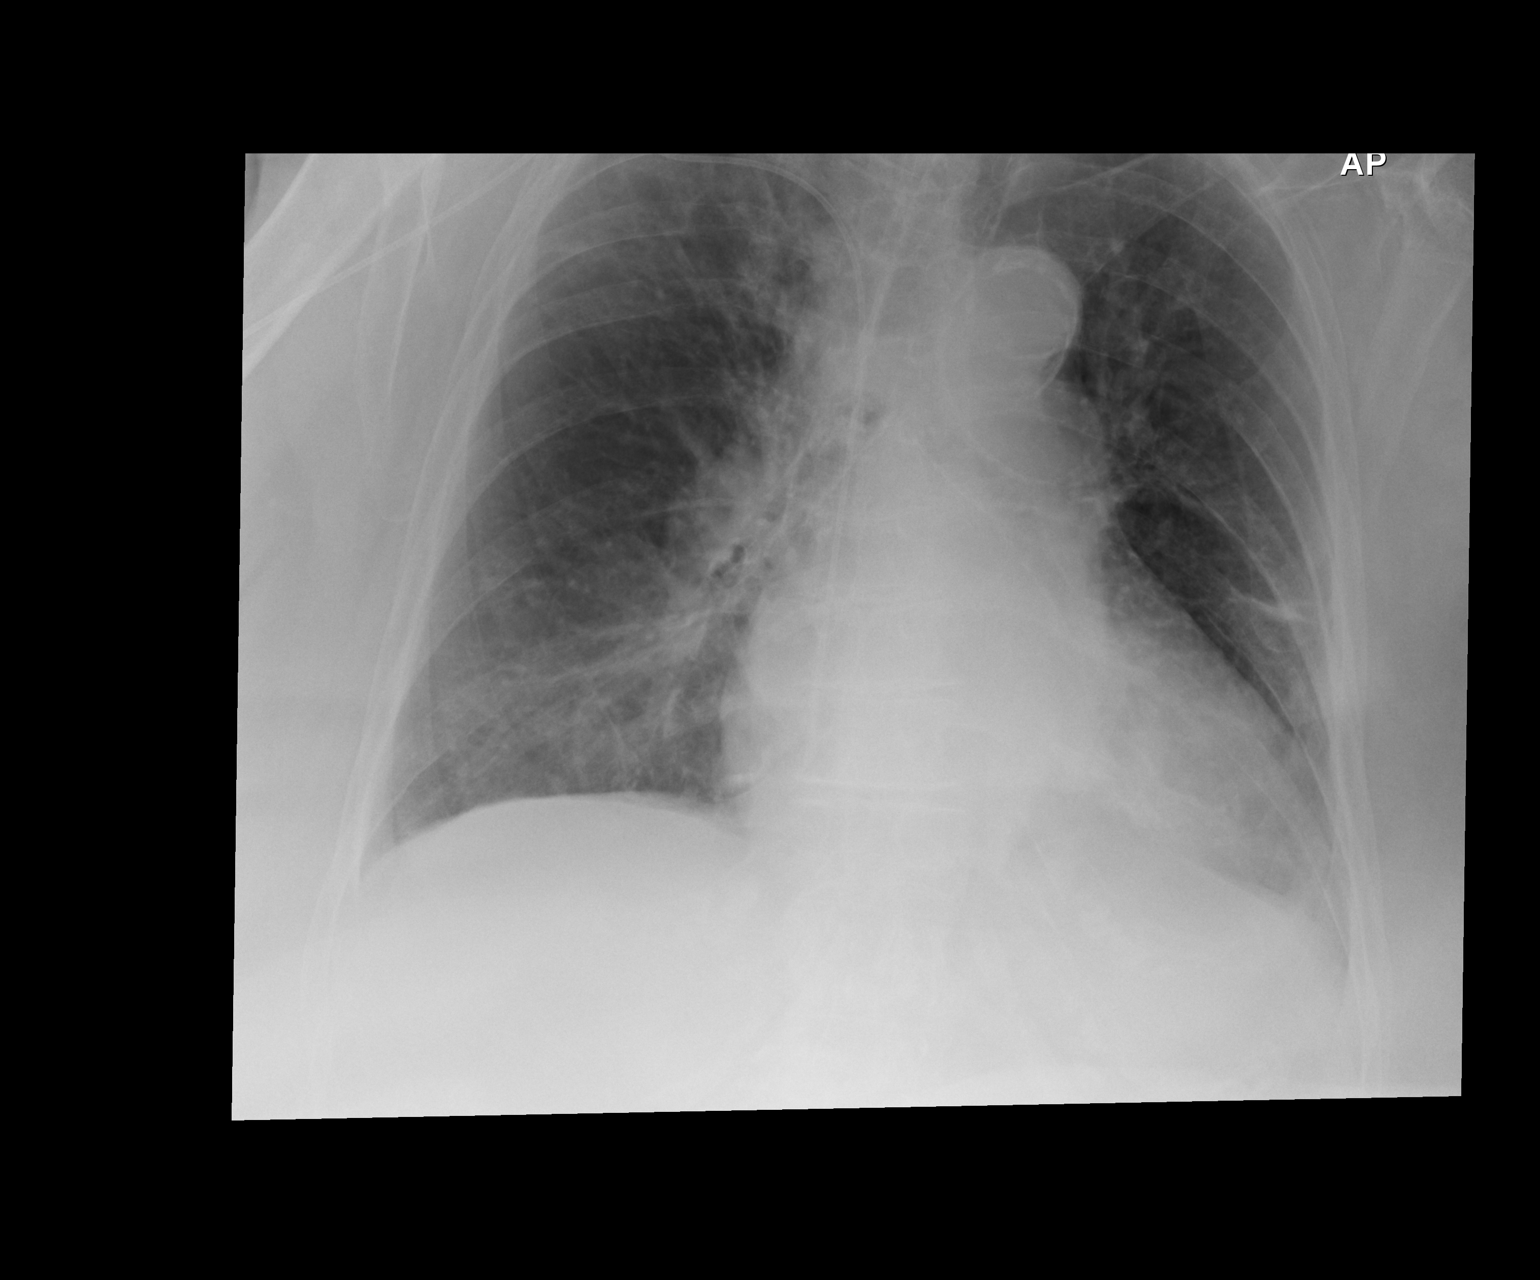

[1 of 1 positions shown; findings below may reference images not displayed]

FINDINGS: Tip of right arm PICC line projects over right atrium.
Enlargement of cardiac silhouette with pulmonary vascular
congestion.
Calcified tortuous aorta.
Bibasilar atelectasis.
Bronchitic and question emphysematous changes.
Question mild infiltrate right upper lobe.
No gross pleural effusion or pneumothorax.
IMPRESSION: Tip of right arm PICC line projects over right atrium; recommend
withdrawal 5 cm for positioning at cavoatrial junction.
Enlargement of cardiac silhouette with pulmonary vascular
congestion.
Bronchitic question emphysematous changes with bibasilar
atelectasis and questionable right upper lobe infiltrate.

## 2014-05-17 IMAGING — CR DG CHEST 1V PORT
1 series · 1 of 1 positions shown · non-contrast
Comparison: 01/23/2013

CLINICAL DATA: The PICC line placement.

PORTABLE CHEST - 1 VIEW

[AP]
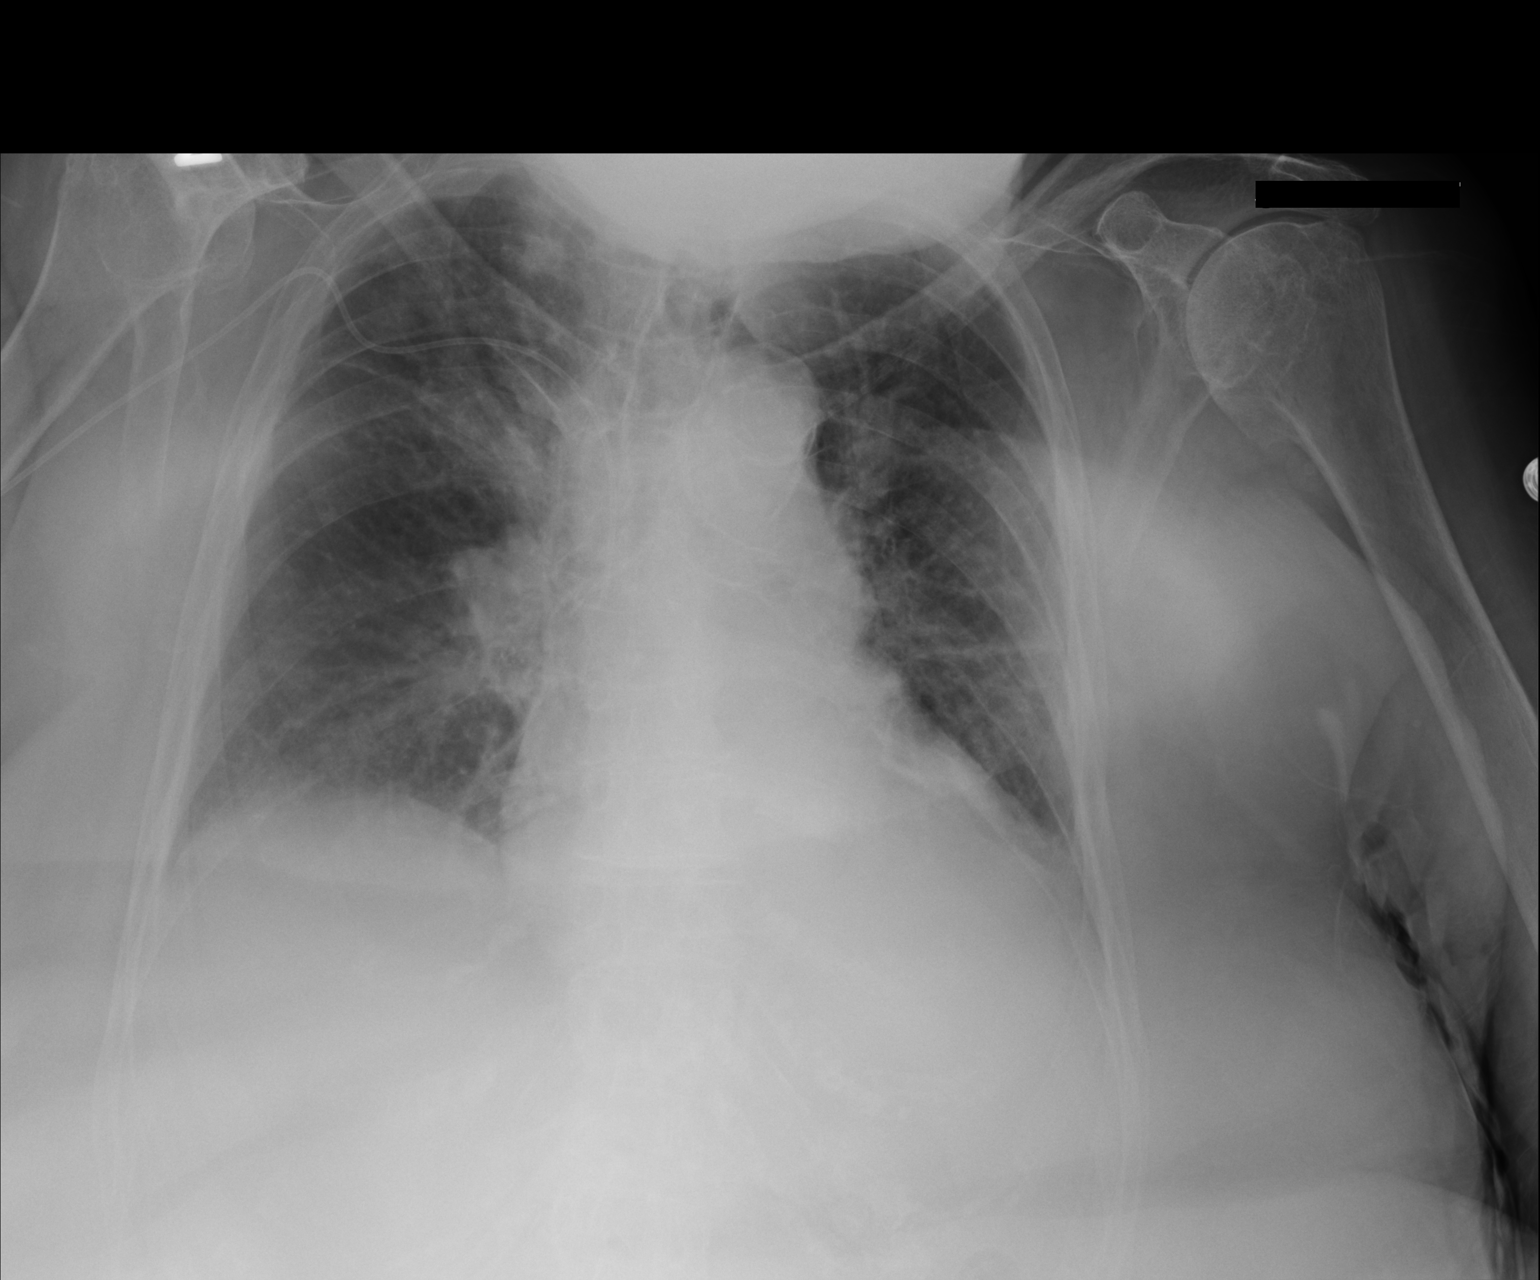

[1 of 1 positions shown; findings below may reference images not displayed]

FINDINGS: Right PICC line is in place with the tip in the SVC.
Mild cardiomegaly.  Bibasilar atelectasis and vascular congestion.
Decreasing lung volumes since prior study.
IMPRESSION: Right PICC line pulled back with the tip now in the SVC.

Shallow inspiration.  Vascular congestion and bibasilar
atelectasis.

## 2014-05-18 IMAGING — CR DG CHEST 1V PORT
1 series · 1 of 1 positions shown · non-contrast
Comparison: 01/23/2013

CLINICAL DATA: Shortness of breath.  Hypertension.  History of
pulmonary embolus.

PORTABLE CHEST - 1 VIEW

[AP]
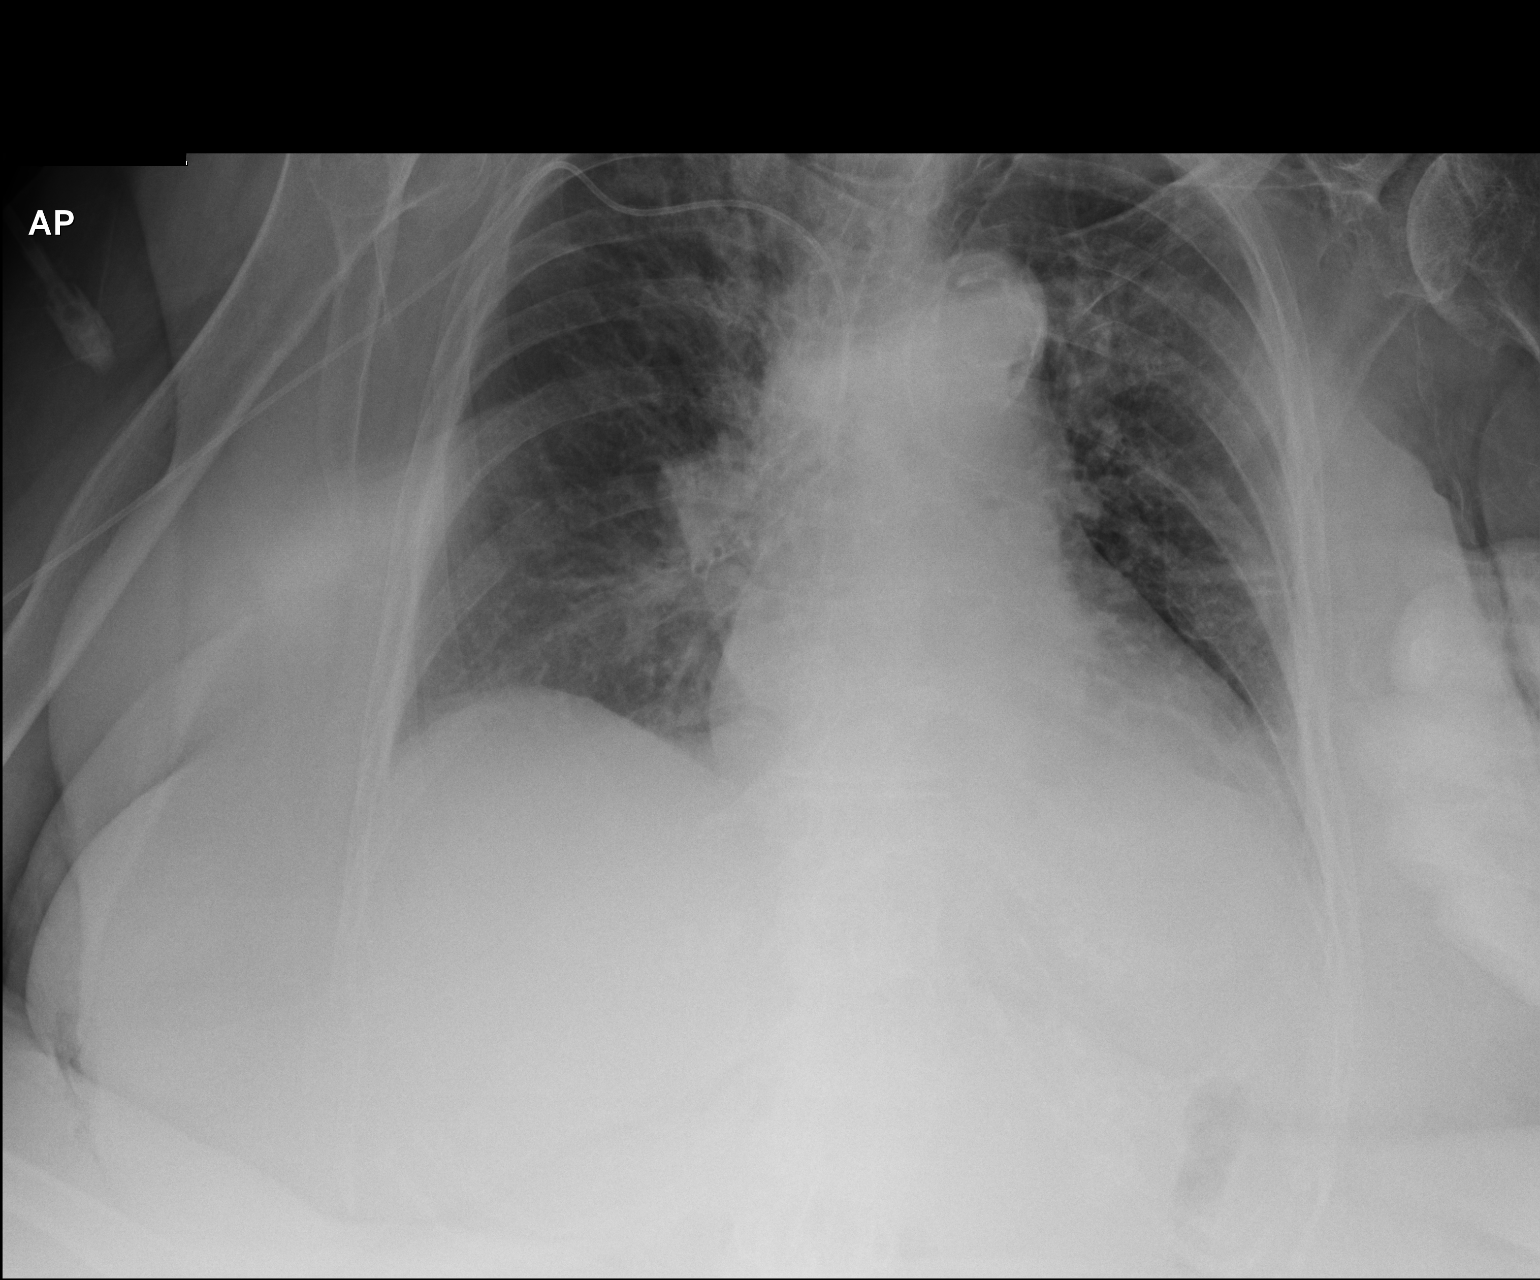

[1 of 1 positions shown; findings below may reference images not displayed]

FINDINGS: Right PICC line is stable in position.  Shallow
inspiration.  Mild cardiac enlargement with pulmonary vascular
congestion.  Mild interstitial edema.  No developing consolidation.
Atelectasis in the left lung base.  No pneumothorax.  Calcification
of the aorta.
IMPRESSION: Stable appearance of the chest since previous study.  Continued
cardiac enlargement pulmonary vascular congestion with mild edema.
Atelectasis left lung base.
